# Patient Record
Sex: Male | Born: 1935
Health system: Southern US, Community
[De-identification: ages and names within clinical notes are randomized; demographics above are authoritative.]

## PROBLEM LIST (undated history)

## (undated) DIAGNOSIS — M5417 Radiculopathy, lumbosacral region: Secondary | ICD-10-CM

## (undated) DIAGNOSIS — M8580 Other specified disorders of bone density and structure, unspecified site: Secondary | ICD-10-CM

## (undated) DIAGNOSIS — E785 Hyperlipidemia, unspecified: Secondary | ICD-10-CM

## (undated) DIAGNOSIS — M21372 Foot drop, left foot: Secondary | ICD-10-CM

## (undated) DIAGNOSIS — I2510 Atherosclerotic heart disease of native coronary artery without angina pectoris: Secondary | ICD-10-CM

## (undated) DIAGNOSIS — C801 Malignant (primary) neoplasm, unspecified: Secondary | ICD-10-CM

## (undated) DIAGNOSIS — L719 Rosacea, unspecified: Secondary | ICD-10-CM

## (undated) DIAGNOSIS — H9190 Unspecified hearing loss, unspecified ear: Secondary | ICD-10-CM

## (undated) DIAGNOSIS — I10 Essential (primary) hypertension: Secondary | ICD-10-CM

## (undated) DIAGNOSIS — I739 Peripheral vascular disease, unspecified: Secondary | ICD-10-CM

## (undated) DIAGNOSIS — G5730 Lesion of lateral popliteal nerve, unspecified lower limb: Secondary | ICD-10-CM

## (undated) DIAGNOSIS — G1221 Amyotrophic lateral sclerosis: Secondary | ICD-10-CM

## (undated) DIAGNOSIS — R269 Unspecified abnormalities of gait and mobility: Principal | ICD-10-CM

## (undated) DIAGNOSIS — M47816 Spondylosis without myelopathy or radiculopathy, lumbar region: Secondary | ICD-10-CM

## (undated) DIAGNOSIS — G63 Polyneuropathy in diseases classified elsewhere: Secondary | ICD-10-CM

## (undated) HISTORY — DX: Rosacea, unspecified: L71.9

## (undated) HISTORY — DX: Atherosclerotic heart disease of native coronary artery without angina pectoris: I25.10

## (undated) HISTORY — DX: Foot drop, left foot: M21.372

## (undated) HISTORY — DX: Lesion of lateral popliteal nerve, unspecified lower limb: G57.30

## (undated) HISTORY — DX: Unspecified abnormalities of gait and mobility: R26.9

## (undated) HISTORY — DX: Polyneuropathy in diseases classified elsewhere: G63

## (undated) HISTORY — PX: CARDIAC CATHETERIZATION: SHX172

## (undated) HISTORY — DX: Peripheral vascular disease, unspecified: I73.9

## (undated) HISTORY — DX: Amyotrophic lateral sclerosis: G12.21

## (undated) HISTORY — DX: Other specified disorders of bone density and structure, unspecified site: M85.80

## (undated) HISTORY — PX: BACK SURGERY: SHX140

## (undated) HISTORY — DX: Spondylosis without myelopathy or radiculopathy, lumbar region: M47.816

## (undated) HISTORY — PX: SPINAL CORD STIMULATOR IMPLANT: SHX2422

## (undated) HISTORY — DX: Radiculopathy, lumbosacral region: M54.17

## (undated) HISTORY — DX: Unspecified hearing loss, unspecified ear: H91.90

## (undated) HISTORY — DX: Hyperlipidemia, unspecified: E78.5

## (undated) HISTORY — DX: Essential (primary) hypertension: I10

## (undated) HISTORY — DX: Malignant (primary) neoplasm, unspecified: C80.1

## (undated) HISTORY — PX: CORONARY ARTERY BYPASS GRAFT: SHX141

## (undated) HISTORY — PX: CORONARY STENT PLACEMENT: SHX1402

---

## 1995-05-22 HISTORY — PX: LUNG LOBECTOMY: SHX167

## 2005-12-20 ENCOUNTER — Ambulatory Visit: Admission: RE | Admit: 2005-12-20 | Discharge: 2005-12-20 | Payer: Self-pay | Admitting: Neurosurgery

## 2006-01-08 ENCOUNTER — Inpatient Hospital Stay (HOSPITAL_COMMUNITY): Admission: RE | Admit: 2006-01-08 | Discharge: 2006-01-11 | Payer: Self-pay | Admitting: Neurosurgery

## 2007-01-08 ENCOUNTER — Inpatient Hospital Stay (HOSPITAL_COMMUNITY): Admission: RE | Admit: 2007-01-08 | Discharge: 2007-01-09 | Payer: Self-pay | Admitting: Neurosurgery

## 2007-01-15 ENCOUNTER — Observation Stay (HOSPITAL_COMMUNITY): Admission: RE | Admit: 2007-01-15 | Discharge: 2007-01-15 | Payer: Self-pay | Admitting: Neurosurgery

## 2010-10-03 NOTE — H&P (Signed)
Brandon Smith, Brandon Smith NO.:  [id number]   MEDICAL RECORD NO.:  [id number]          PATIENT TYPE:  INP   LOCATION:  3037                         FACILITY:  [facility]   PHYSICIAN:  Payton Doughty, M.D.      DATE OF BIRTH:  [birth year]   DATE OF ADMISSION:  01/08/2007  DATE OF DISCHARGE:                              HISTORY & PHYSICAL   ADMISSION DIAGNOSES:  1. Spondylosis.  2. 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   This is a very nice now-77 year old right-handed white gentleman who has  had pain in his hip and in his back.  He had a right 5-1 diskectomy in  1997, and he also had a peroneal nerve injury on the left side after a  tibial fracture.  He has had an attempted repair and it has failed.  He  is left with dysesthetic pain much worse on the left than on the right,  but pain in both lower extremities.   PAST MEDICAL HISTORY:  1. Remarkable for coronary artery disease.  He has had a bypass in      1989.  2. Lung operation in 1997 by Dr. Edwyna Shell.   MEDICATIONS:  Vytorin, Benicar, Avodart, aspirin, Aleve, prednisone,  Percocet.   ALLERGIES:  Altace.   SOCIAL HISTORY:  He does not smoke or drink.  He is retired from [employer].   FAMILY HISTORY:  Both parents are deceased, history not given.   REVIEW OF SYSTEMS:  Remarkable for difficulty starting stream, back pain  and leg pain.   PHYSICAL EXAMINATION:  HEENT:  Examination is within normal limits.  He  has reasonable range of motion of neck.  CHEST:  Clear.  CARDIAC:  Regular rate and rhythm.  ABDOMEN:  Nontender.  No hepatosplenomegaly.  EXTREMITIES:  Without clubbing or cyanosis.  GENITOURINARY:  Examination is deferred.  Peripheral pulses are good.  NEUROLOGIC:  He is awake, alert, oriented.  His cranial nerves are  intact.  Motor examination demonstrates 5/5 strength throughout the  upper and lower extremities save for the dorsiflexors on the left, which  are weak because of his peroneal nerve.   CLINICAL IMPRESSION:  Residual peroneal nerve palsy from tibial  fracture.   PLAN:  Spinal cord stimulator.  Need to cover from mid-T8 up to T10.  Risks and benefits have been discussed with him, and he wishes to  proceed.           ______________________________  Payton Doughty, M.D.     MWR/MEDQ  D:  01/08/2007  T:  01/08/2007  Job:  [id number]

## 2010-10-03 NOTE — H&P (Signed)
NAMEKAMALI, NEPHEW NO.:  [id number]   MEDICAL RECORD NO.:  [id number]          PATIENT TYPE:  INP   LOCATION:  5149                         FACILITY:  [facility]   PHYSICIAN:  Payton Doughty, M.D.      DATE OF BIRTH:  Jun 29, 1932   DATE OF ADMISSION:  01/15/2007  DATE OF DISCHARGE:                              HISTORY & PHYSICAL   ADMITTING DIAGNOSIS:  Intractable left leg pain.   SERVICE:  Neurosurgery.   This is a 77 year old gentleman who a week ago underwent placement of a  spinal cord stimulator.  He has good coverage except he needs it a  little bit lower to cover the bottom of his feet.  He is readmitted for  revision of his primary site and implantation of a battery.   MEDICAL HISTORY:  Is fairly benign.  He has coronary artery disease.  He  had a bypass in 1989, has had no heart since then.  He had a 5-1  diskectomy in 1997, lung operation in 1997, repair of a tibial plateau  fracture that left him with peroneal nerve palsy.   MEDICATIONS:  Vytorin, Benicar, Avodart, aspirin, Aleve, prednisone and  Percocet.   ALLERGIES:  ALTACE.   SOCIAL HISTORY:  Does not smoke, does not drink.  He is retired from  [employer].   FAMILY HISTORY:  Both parents are deceased.   REVIEW OF SYSTEMS:  Remarkable for difficulty starting stream, back  pain, leg pain.   HEENT EXAM.  Within normal limits.  He has reasonable range of motion of  his neck.  CHEST:  Clear.  CARDIAC:  Regular rate and rhythm.  ABDOMEN:  Nontender, no hepatosplenomegaly.  EXTREMITIES:  Without clubbing or cyanosis.  GU EXAM:  Deferred.  PERIPHERAL PULSES:  Good.  NEUROLOGICAL:  He is awake, alert and oriented.  His cranial nerves are  intact save for bilateral hearing loss.  Motor exam shows 5/5 strength  throughout the upper and lower extremities save for the dorsiflexion  weakness on the left side.  He is areflexic in the lower extremities.  The incision site looks good.   CLINICAL IMPRESSION:   Intractable pain with good coverage but can be  improved.   The plan is to drop his electrode down one level and then permanently  implant his small battery.  The risks and benefits of this approach have  been discussed with him and he wished to proceed.           ______________________________  Payton Doughty, M.D.     MWR/MEDQ  D:  01/15/2007  T:  01/15/2007  Job:  [id number]

## 2010-10-03 NOTE — Op Note (Signed)
NAMEVALMORE, ARABIE NO.:  [id number]   MEDICAL RECORD NO.:  [id number]          PATIENT TYPE:  INP   LOCATION:  3037                         FACILITY:  [facility]   PHYSICIAN:  Payton Doughty, M.D.      DATE OF BIRTH:  1932/08/06   DATE OF PROCEDURE:  01/08/2007  DATE OF DISCHARGE:                               OPERATIVE REPORT   PREOPERATIVE DIAGNOSIS:  Intractable leg pain.   POSTOPERATIVE DIAGNOSIS:  Intractable leg pain.   OPERATIVE PROCEDURE:  Implantation of spinal cord simulator.   SURGEON:  Payton Doughty, M.D.   ANESTHESIA:  General endotracheal __________ .   COMPLICATIONS:  None.   ASSISTANT:  [place].   BODY OF TEXT:  This is a 76 year old gentleman with bilateral lower  extremity pain, much worse on the left than on the right, taken to the  operating room, sequentially anesthetized and intubated, placed prone on  the operating room table.  Incision was localized with x-ray, following  shaved, prepped and draped in the usual sterile fashion.  The skin was  infiltrated with 1% lidocaine with 1:400,000 epinephrine.  The skin was  incised from over the lamina of T11.  The lamina of T11 was dissected  free in the subperiosteal plane.  Intraoperative x-ray confirmed correct  level.  A small laminectomy of T11 was carried out to allow access to  the posterior epidural space.  Through this an 61 electrode was advanced  such that it covered from mid to upper T11 to lower T8.  This covered  all of T10 and all of T9.  Intraoperative x-ray confirmed good  placement.  The wound was irrigated.  The __________  devices were sewn  into place and the electrodes were attached to the extension cords.  The  extension cords were tunneled using the trocar and exited by separate  incision.  The wires were folded into the incision and closed in several  layers of 0 Vicryl, 2-0 Vicryl and 3-0 nylon.  Betadine and Telfa  dressing was applied at both sides and the patient  returned to the  recovery room in good condition.           ______________________________  Payton Doughty, M.D.     MWR/MEDQ  D:  01/08/2007  T:  01/08/2007  Job:  [id number]

## 2010-10-03 NOTE — Op Note (Signed)
[name], Brandon Smith NO.:  [id number]   MEDICAL RECORD NO.:  [id number]          PATIENT TYPE:  INP   LOCATION:  5149                         FACILITY:  [facility]   PHYSICIAN:  Payton Doughty, M.D.      DATE OF BIRTH:  09/16/1932   DATE OF PROCEDURE:  01/15/2007  DATE OF DISCHARGE:  01/15/2007                               OPERATIVE REPORT   PREOPERATIVE DIAGNOSIS:  Spinal cord stimulator placement.   POSTOPERATIVE DIAGNOSIS:  Spinal cord stimulator placement.   OPERATIVE PROCEDURE:  Revision of spinal cord stimulator electrode and  implantation of battery.   SURGEON:   ANESTHESIA:  General endotracheal anesthesia.   PREPARATION:  Betadine prep and alcohol wipe.   SURGEON:  Payton Doughty, M.D.   BODY OF TEXT:  This is a 77 year old gentleman who has had a stimulator  placed.  He has good coverage down to the top of his and light coverage  on the bottom of his feet.  He was taken to the operating room, smoothly  anesthetized, and intubated, placed prone on the operating table.  Following shave, prep, and drape in the usual sterile fashion, an  electrode was mobilized from this incision.  The extension wires were  disconnected, divided, and removed.  Further laminectomy was carried out  inferiorly over T8 and the electrode pulled back so that it covered from  the top of T8 to the bottom of T10. Previously, the electrodes that had  been working for the patient were at the T9-T10 interspace, so it was  felt that further coverage lower down was warranted.  Following x-ray  confirmation of good placement, they were anchored in place.  Subcutaneous wire was in the right flank and in a subcutaneous pocket,  the battery was placed after being connected to the electrodes.  Impedance testing showed low impedance. Both incisions were irrigated  with bacitracin and closed in successive layers of 2-0 Vicryl and 3-0  nylon.  Betadine and Telfa dressing was applied.  The patient  returned  to the recovery room in good condition.           ______________________________  Payton Doughty, M.D.     MWR/MEDQ  D:  01/15/2007  T:  01/15/2007  Job:  [id number]

## 2010-10-06 NOTE — H&P (Signed)
NAME:  Brandon Smith, Brandon Smith NO.:  [id number]   MEDICAL RECORD NO.:  [id number]          PATIENT TYPE:  INP   LOCATION:  NA                           FACILITY:  [facility]   PHYSICIAN:  Payton Doughty, M.D.      DATE OF BIRTH:  [birth year]   DATE OF ADMISSION:  01/08/2006  DATE OF DISCHARGE:                                HISTORY & PHYSICAL   ADMISSION DIAGNOSIS:  Spondylosis on the left side at L4-5 and L5-S1.   The patient is a 77 year old right-handed white gentleman since beginning of  July started pain into left hip going down to his left leg and his low back.  Quite difficult to walk with and bladder had not been affected.   MEDICAL HISTORY:  Remarkable for coronary artery disease.  He had a bypass  in 1989 and a right 5-1 diskectomy in 1997 and a lung operation by Dr.  Edwyna Shell in 1997.  He had a left tibial fracture in 1999 that left him with a  peroneal nerve palsy on that side and attempted repair failed.   MEDICATIONS ON ADMISSION:  Vytorin 10/40 twice a day, Benicar 10 mg a day,  Avodart 0.5 mg a day, aspirin a day, Aleve 200 mg twice daily, prednisone 10  mg a day and Percocet 5/225 on as needed basis.   ALLERGIES:  ALTACE.   SOCIAL HISTORY:  Does not smoke, does not drink.  Is retired from [employer].   FAMILY HISTORY:  Both parents are deceased. History is not given.   REVIEW OF SYSTEMS:  Remarkable for difficulty __________, extreme back pain,  leg pain.  His HEENT exam is within normal limits.  He had reasonable range  of motion of neck.  Chest clear.  Cardiac exam is regular rate and rhythm.  Abdomen is nontender, no hepatosplenomegaly.  Extremities without cyanosis,  clubbing.  GU exam is deferred.  Peripheral pulses are good.  Neurologically, he is awake, alert and oriented.  Cranial nerves are intact  save for bilateral hearing loss.  Motor exam shows 5/5 strength throughout  the upper and lower extremities save for the dorsiflexors left side which  were 0/5.   There is significant sensory deficit in left peroneal  distribution.  Reflexes are 2 at the knees, absent left ankle obviously,  flicker at right ankle.  Straight leg raise is negative.  MR demonstrates  spondylosis at 4-5 and 5-1.  5-1 is a transitional segment.  There is  laminectomy defect on the right side.   CLINICAL IMPRESSION:  Severe lumbar spondylosis and radiculopathy.   PLAN:  For decompression of L4-5 and L5-S1 laminotomy and foraminotomy and  diskectomy at 5-1.  Risks and benefits of this approach have been discussed  with him and he wished to proceed.           ______________________________  Payton Doughty, M.D.     MWR/MEDQ  D:  01/08/2006  T:  01/08/2006  Job:  [id number]

## 2010-10-06 NOTE — Discharge Summary (Signed)
NAME:  Brandon Smith, Brandon Smith NO.:  [id number]   MEDICAL RECORD NO.:  [id number]          PATIENT TYPE:  INP   LOCATION:  3023                         FACILITY:  [facility]   PHYSICIAN:  Payton Doughty, M.D.      DATE OF BIRTH:  1932/11/02   DATE OF ADMISSION:  01/08/2006  DATE OF DISCHARGE:  01/11/2006                                 DISCHARGE SUMMARY   ADMISSION DIAGNOSIS:  Spondylosis at L4-5 and L5-S1.   DISCHARGE DIAGNOSIS:  Spondylosis at L4-5 and L5-S1, plus urinary retention.   SURGEON:  Payton Doughty, M.D.   COMPLICATIONS:  None.   DISCHARGE ALIGNMENT:  Aligned well, [employer].   A 77 year old right hand white gentleman whose history and physical is  recounted in chart.  He has had difficulty down his left leg and had  significant spondylosis.  Medical history is benign.  Neurologic exam showed  a lumbar radiculopathy on the left side at L5 and S1.  He was admitted after  having normal laboratory values and underwent a L5-S1 laminotomy and  foraminotomy on the left side.  Postoperatively he did well.  He had  complete relief of his leg pain.  Strength is full save for the dorsiflexion  weakness that existed previously due to peroneal nerve injury.  He had  difficulty voiding, catheter was placed overnight.  Voiding trial:  He is  able to void spontaneously with a residual of about 100 cc.  He is being  discharged home in the care of his family with Percocet for pain.  He is to  follow up with me at the [facility] office in about a  week for sutures.           ______________________________  Payton Doughty, M.D.     MWR/MEDQ  D:  01/11/2006  T:  01/11/2006  Job:  [id number]

## 2010-10-06 NOTE — Op Note (Signed)
NAME:  Brandon Smith, Brandon Smith NO.:  [id number]   MEDICAL RECORD NO.:  [id number]          PATIENT TYPE:  INP   LOCATION:  3023                         FACILITY:  [facility]   PHYSICIAN:  Payton Doughty, M.D.      DATE OF BIRTH:  [birth year]   DATE OF PROCEDURE:  01/08/2006  DATE OF DISCHARGE:                                 OPERATIVE REPORT   SURGEON:  Payton Doughty, M.D.   DOCTOR ASSISTANT:  Coletta Memos, M.D.   NURSE ASSISTANT:  [place].   SERVICE:  Neurosurgery.   PREOPERATIVE DIAGNOSIS:  Spondylosis, L4-5, L5-S1 on the left.   POSTOPERATIVE DIAGNOSIS:  Spondylosis, L4-5, L5-S1 on the left.   OPERATIVE PROCEDURE:  L4-5, L5-S1 laminotomy and foraminotomy, done on the  left side.   ANESTHESIA:  General endotracheal.   PREPARATION:  Betadine Scrub with alcohol wipe.   COMPLICATIONS:  None.   BODY OF TEXT:  A 77 year old gentleman with left L5 and S1 radiculopathy,  who was taken to the operating room, smoothly anesthetized, intubated,  placed prone on the operating table.  Following shave, prep and drape in the  usual sterile fashion, the skin was infiltrated with 1% lidocaine with  1:400,000 epinephrine.  The old skin incision was reopened.  The laminae of  L4 and L5 were dissected on the left side.  An intraoperative x-ray  confirmed the correctness of the level.  Having confirmed correctness of the  level, hemisemilaminectomy was carried out on the left side to the top of  the ligamentum flavum, and it was removed in a retrograde fashion.  The  lateral aspect of the canal was then explained and the neural foramina  decompressed.  L4-5 appeared to be more compressed than 5-1.  Following  complete decompression, the wound was irrigated and hemostasis assured, and  the laminectomy defect was filled with Depo-Medrol-soaked fat.  Successive  layers of 0 Vicryl, 2-0 Vicryl and 3-0 nylon were used to close.  Betadine  and Telfa dressing was applied, and the patient  returned to the recovery  room in good condition.           ______________________________  Payton Doughty, M.D.     MWR/MEDQ  D:  01/08/2006  T:  01/09/2006  Job:  [id number]

## 2011-03-02 LAB — COMPREHENSIVE METABOLIC PANEL
ALT: 15
Alkaline Phosphatase: 58
Glucose, Bld: 118 — ABNORMAL HIGH
Potassium: 4.4
Sodium: 139
Total Protein: 7.2

## 2011-03-02 LAB — URINALYSIS, ROUTINE W REFLEX MICROSCOPIC
Bilirubin Urine: NEGATIVE
Glucose, UA: NEGATIVE
Hgb urine dipstick: NEGATIVE
Specific Gravity, Urine: 1.012
pH: 6

## 2011-03-02 LAB — CBC
Hemoglobin: 12.6 — ABNORMAL LOW
RBC: 4.28
RDW: 12.9
WBC: 5.7

## 2011-03-02 LAB — URINE CULTURE: Colony Count: 30000

## 2011-03-02 LAB — ABO/RH: ABO/RH(D): A POS

## 2011-03-02 LAB — PROTIME-INR: INR: 0.9

## 2011-03-02 LAB — TYPE AND SCREEN
ABO/RH(D): A POS
Antibody Screen: NEGATIVE
Antibody Screen: NEGATIVE

## 2011-03-02 LAB — DIFFERENTIAL
Basophils Relative: 0
Eosinophils Absolute: 0.7
Monocytes Absolute: 0.3
Monocytes Relative: 6
Neutrophils Relative %: 58

## 2011-03-05 LAB — CBC
Hemoglobin: 13
RBC: 4.44
WBC: 5.9

## 2011-03-05 LAB — COMPREHENSIVE METABOLIC PANEL
ALT: 20
AST: 19
Alkaline Phosphatase: 59
CO2: 29
GFR calc non Af Amer: >60
Glucose, Bld: 105 — ABNORMAL HIGH
Potassium: 4.5
Sodium: 136

## 2011-03-05 LAB — URINE MICROSCOPIC-ADD ON

## 2011-03-05 LAB — DIFFERENTIAL
Basophils Relative: 0
Eosinophils Absolute: 0.3
Eosinophils Relative: 5
Monocytes Relative: 4
Neutrophils Relative %: 66

## 2011-03-05 LAB — URINALYSIS, ROUTINE W REFLEX MICROSCOPIC
Bilirubin Urine: NEGATIVE
Glucose, UA: NEGATIVE
Hgb urine dipstick: NEGATIVE
Specific Gravity, Urine: 1.011
Urobilinogen, UA: 0.2

## 2011-03-05 LAB — PROTIME-INR: INR: 0.9

## 2012-01-10 ENCOUNTER — Other Ambulatory Visit: Payer: Self-pay | Admitting: Neurosurgery

## 2012-01-10 DIAGNOSIS — M47817 Spondylosis without myelopathy or radiculopathy, lumbosacral region: Secondary | ICD-10-CM

## 2012-01-15 ENCOUNTER — Ambulatory Visit
Admission: RE | Admit: 2012-01-15 | Discharge: 2012-01-15 | Disposition: A | Discharge status: Home / Self Care | Payer: Medicare Other | Source: Ambulatory Visit | Attending: Neurosurgery | Admitting: Neurosurgery

## 2012-01-15 DIAGNOSIS — M47817 Spondylosis without myelopathy or radiculopathy, lumbosacral region: Secondary | ICD-10-CM

## 2012-01-30 ENCOUNTER — Other Ambulatory Visit: Payer: Self-pay

## 2012-01-30 DIAGNOSIS — M79609 Pain in unspecified limb: Secondary | ICD-10-CM

## 2012-02-06 ENCOUNTER — Encounter: Payer: Self-pay | Admitting: Vascular Surgery

## 2012-02-11 ENCOUNTER — Encounter: Payer: Self-pay | Admitting: Vascular Surgery

## 2012-02-12 ENCOUNTER — Encounter: Payer: Self-pay | Admitting: Vascular Surgery

## 2012-02-12 ENCOUNTER — Ambulatory Visit (INDEPENDENT_AMBULATORY_CARE_PROVIDER_SITE_OTHER): Payer: Medicare Other | Admitting: Vascular Surgery

## 2012-02-12 VITALS — BP 150/65 | HR 53 | Resp 16 | Ht 72.0 in | Wt 177.9 lb

## 2012-02-12 DIAGNOSIS — M79609 Pain in unspecified limb: Secondary | ICD-10-CM

## 2012-02-12 DIAGNOSIS — I998 Other disorder of circulatory system: Secondary | ICD-10-CM | POA: Insufficient documentation

## 2012-02-12 DIAGNOSIS — M79606 Pain in leg, unspecified: Secondary | ICD-10-CM

## 2012-02-12 NOTE — Progress Notes (Signed)
Patient complains of tingling in the feet 99% of the time also has weakness in the legs and complains of aching in primarily the right leg, hip, and back, when trying to sleep at night.

## 2012-02-12 NOTE — Progress Notes (Signed)
[facility]   Patient name: Brandon Smith MRN: [id number] DOB: 1932-11-22 Sex: male   Referred by: Dr Trey Sailors  Reason for referral:  Chief Complaint  Patient presents with  . New Evaluation    LE vascular insuff/ Dr. Trey Sailors    HISTORY OF PRESENT ILLNESS: Patient is a 78 year old gentleman with progressive weakness and pain in both lower extremity. He reports that initially he was on a walking program recommended by his cardiologist. Over time he began more and more uncomfortable with walking. He now a walker a little before his legs become tired and "give out" he also reports some lower surety weakness when he walks for a great period of time. He began riding a stationary bike reported pain in his knees with a riding the bike. He also reports a sensation of a stinging and burning sensation in both lower extremities at night which could him from sleeping. He does have a prior history of fracture and trauma with plating in his left tibia. He reportedly had a foot drop associated with this and has had several surgeries to try to correct this unsuccessfully. He does wear a pocket brace in the left foot. He has no history of lower extremity tissue loss.  Past Medical History  Diagnosis Date  . Peroneal nerve palsy     left   . Hypertension   . Foot drop, left     chronic  . Lumbar spondylosis   . CAD (coronary artery disease)   . Cancer     lung  . Peripheral vascular disease     Past Surgical History  Procedure Date  . Back surgery 2952,[id number]    lumbosacral  . Spinal cord stimulator implant   . Lung lobectomy   . Cardiac catheterization   . Coronary stent placement     X4    History   Social History  . Marital Status: Married    Spouse Name: N/A    Number of Children: N/A  . Years of Education: N/A   Occupational History  . Not on file.   Social History Main Topics  . Smoking status: Former Smoker    Types: Cigarettes    Quit date:  07/19/1980  . Smokeless tobacco: Never Used  . Alcohol Use: No  . Drug Use: No  . Sexually Active: Not on file   Other Topics Concern  . Not on file   Social History Narrative  . No narrative on file    Family History  Problem Relation Age of Onset  . Cancer Mother   . Heart disease Mother   . Hyperlipidemia Mother   . Heart attack Mother   . Heart disease Father     before age 14  . Heart attack Father   . Cancer Sister     breast  . Hyperlipidemia Sister   . Hypertension Sister   . Heart attack Sister   . Cancer Brother     lung  . Heart disease Brother   . Hyperlipidemia Son     Allergies as of 02/12/2012 - Review Complete 02/12/2012  Allergen Reaction Noted  . Altace (ramipril) Rash 02/06/2012    Current Outpatient Prescriptions on File Prior to Visit  Medication Sig Dispense Refill  . aspirin 81 MG tablet Take 81 mg by mouth daily.      . carvedilol (COREG) 12.5 MG tablet Take 12.5 mg by mouth Twice daily.      . clopidogrel (  PLAVIX) 75 MG tablet Take 75 mg by mouth Daily.      . furosemide (LASIX) 20 MG tablet Take 20 mg by mouth Daily.      Marland Kitchen losartan (COZAAR) 100 MG tablet Take 100 mg by mouth Daily.      . rosuvastatin (CRESTOR) 10 MG tablet Take 10 mg by mouth daily.      . Zinc 100 MG TABS Take 100 mg by mouth daily.      . bisacodyl (BISACODYL) 5 MG EC tablet Take 5 mg by mouth daily as needed.      . Pitavastatin Calcium (LIVALO) 4 MG TABS Take 4 mg by mouth daily.         REVIEW OF SYSTEMS:  Positives indicated with an "X"  CARDIOVASCULAR:  [ ]  chest pain   [ ]  chest pressure   [ ]  palpitations   [ ]  orthopnea   [ ]  dyspnea on exertion   [ ]  claudication   [ ]  rest pain   [ ]  DVT   [ ]  phlebitis PULMONARY:   [ ]  productive cough   [ ]  asthma   [ ]  wheezing NEUROLOGIC:   [x ] weakness  [ ]  paresthesias  [ ]  aphasia  [ ]  amaurosis  [ ]  dizziness HEMATOLOGIC:   [ ]  bleeding problems   [ ]  clotting disorders MUSCULOSKELETAL:  [ ]  joint pain   [ ]   joint swelling GASTROINTESTINAL: [ ]   blood in stool  [ ]   hematemesis GENITOURINARY:  [ ]   dysuria  [ ]   hematuria PSYCHIATRIC:  [ ]  history of major depression INTEGUMENTARY:  [ ]  rashes  [ ]  ulcers CONSTITUTIONAL:  [ ]  fever   [ ]  chills  PHYSICAL EXAMINATION:  General: The patient is a well-nourished male, in no acute distress. Vital signs are BP 150/65  Pulse 53  Resp 16  Ht 6' (1.829 m)  Wt 177 lb 14.4 oz (80.695 kg)  BMI 24.13 kg/m2  SpO2 100% Pulmonary: There is a good air exchange bilaterally without wheezing or rales. Abdomen: Soft and non-tender with normal pitch bowel sounds. Musculoskeletal: There are no major deformities.  There is no significant extremity pain. Neurologic: No focal weakness or paresthesias are detected, Skin: There are no ulcer or rashes noted. Psychiatric: The patient has normal affect. Cardiovascular: There is a regular rate and rhythm without significant murmur appreciated. Carotid arteries without bruits bilaterally Pulse status: 2+ radial 2+ femoral and 2+ posterior tibial pulses bilaterally  VVS Vascular Lab Studies:  Ordered and Independently Reviewed lower surety arterial studies reveal normal ankle arm index normal triphasic waveforms bilaterally patient also has normal digital pressures bilaterally  Impression and Plan:  Oh extremity discomfort at rest and with walking. A long discussion with the patient and his wife present. I explained that he does not have any evidence of lower surety arterial insufficiency by physical exam or noninvasive testing which suggests this as an etiology of his discomfort. He will continue his followup with Dr. Teodoro Kil for further recommendations    EARLY, TODD [facility] [place] Office: [phone]

## 2012-04-22 ENCOUNTER — Telehealth: Payer: Self-pay

## 2012-04-22 NOTE — Telephone Encounter (Addendum)
Rec'd phone call from Dr. Loraine Leriche Roy's office requesting approval from Dr. Arbie Cookey for pt. to proceed with Lumbar fusion on 04/23/12.  Also, requests approval for pt. to stop Plavix for 7 days prior to procedure.  Spoke with Dr. Arbie Cookey.  Advised that the arterial studies were normal in our office 02/12/12, suggesting there is no reason from a vascular standpoint that pt. is on Plavix, nor is there any reason the patient should not proceed with surgery.   Will fax above note to Dr. Loraine Leriche Roy's office.

## 2013-01-23 ENCOUNTER — Encounter: Payer: Self-pay | Admitting: Neurology

## 2013-01-28 ENCOUNTER — Encounter: Payer: Self-pay | Admitting: Neurology

## 2013-01-28 ENCOUNTER — Ambulatory Visit (INDEPENDENT_AMBULATORY_CARE_PROVIDER_SITE_OTHER): Payer: Medicare Other | Admitting: Neurology

## 2013-01-28 VITALS — BP 138/58 | HR 53 | Ht 71.0 in | Wt 180.0 lb

## 2013-01-28 DIAGNOSIS — R269 Unspecified abnormalities of gait and mobility: Secondary | ICD-10-CM

## 2013-01-28 DIAGNOSIS — R6889 Other general symptoms and signs: Secondary | ICD-10-CM

## 2013-01-28 DIAGNOSIS — G63 Polyneuropathy in diseases classified elsewhere: Secondary | ICD-10-CM | POA: Insufficient documentation

## 2013-01-28 DIAGNOSIS — D518 Other vitamin B12 deficiency anemias: Secondary | ICD-10-CM

## 2013-01-28 HISTORY — DX: Polyneuropathy in diseases classified elsewhere: G63

## 2013-01-28 HISTORY — DX: Unspecified abnormalities of gait and mobility: R26.9

## 2013-01-28 NOTE — Progress Notes (Signed)
Reason for visit: Gait disorder  Brandon Smith is a 79 y.o. male  History of present illness:  Brandon Smith is a 79 year old right-handed white male with a history of lumbosacral spine disease, and a peripheral neuropathy. The patient had EMG and nerve conduction studies through this office approximately one year ago. This revealed evidence of a peripheral neuropathy of moderate severity, with a severe, end-stage left peroneal neuropathy. The patient had a multilevel bilateral lumbosacral radiculopathy involving the L2-3, L4, and L5 nerve roots, possibly the S1 nerve roots as well. The patient has undergone lumbosacral spine surgery on 2 occasions since that time, one surgery was done in December 2013, and another surgery was done in February 2014 by Dr. Channing Mutters. Since the surgery, the patient indicates that he has developed a worsening gait problem, and he has had multiple falls, with the last fall about 2 weeks ago. The patient is now using a walker in the house, and he uses a cane when out of the house. The patient feels as if he is dragging his left leg at times, and he sometimes feels as if he cannot get the leg to do what he wants it to do. The patient feels weak in the legs, but he is doing exercises at home with leg strengthening and some balance training. The patient feels numb in the feet, with some burning sensations in the feet at night. The patient indicates that this began after the lumbosacral spine surgery. The patient denies any problems controlling the bowels or the bladder. The patient denies any neck pain or arm discomfort, and he denies any memory changes. The patient is sent to this office for further evaluation.  Past Medical History  Diagnosis Date  . Peroneal nerve palsy     left   . Hypertension   . Foot drop, left     chronic  . Lumbar spondylosis   . CAD (coronary artery disease)   . Cancer     lung  . Peripheral vascular disease   . Hearing deficit     right hearing aid   . Abnormality of gait 01/28/2013  . Polyneuropathy in other diseases classified elsewhere 01/28/2013  . Rosacea   . Dyslipidemia   . Osteopenia     Past Surgical History  Procedure Laterality Date  . Back surgery  [id number]    lumbosacral  . Spinal cord stimulator implant    . Lung lobectomy    . Cardiac catheterization    . Coronary stent placement      X4    Family History  Problem Relation Age of Onset  . Cancer Mother   . Heart disease Mother   . Hyperlipidemia Mother   . Heart attack Mother   . Heart disease Father     before age 37  . Heart attack Father   . Cancer Sister     breast  . Hyperlipidemia Sister   . Hypertension Sister   . Heart attack Sister   . Cancer Brother     lung  . Heart disease Brother   . Hyperlipidemia Son     Social history:  reports that he quit smoking about 32 years ago. His smoking use included Cigarettes. He smoked 0.00 packs per day. He has never used smokeless tobacco. He reports that he does not drink alcohol or use illicit drugs.  Medications:  Current Outpatient Prescriptions on File Prior to Visit  Medication Sig Dispense Refill  . aspirin 81 MG tablet  Take 81 mg by mouth daily.      . carvedilol (COREG) 12.5 MG tablet Take 12.5 mg by mouth Twice daily.      . clopidogrel (PLAVIX) 75 MG tablet Take 75 mg by mouth Daily.      Tery Sanfilippo Calcium (STOOL SOFTENER PO) Take by mouth daily.      . furosemide (LASIX) 20 MG tablet Take 20 mg by mouth Daily.      Marland Kitchen losartan (COZAAR) 100 MG tablet Take 100 mg by mouth Daily.      . rosuvastatin (CRESTOR) 10 MG tablet Take 10 mg by mouth daily.      . Zinc 100 MG TABS Take 100 mg by mouth daily.       No current facility-administered medications on file prior to visit.      Allergies  Allergen Reactions  . Altace [Ramipril] Rash    ROS:  Out of a complete 14 system review of symptoms, the patient complains only of the following symptoms, and all other reviewed systems are  negative.  Hearing loss, ringing in the ears Moles Weakness of the legs Decreased energy Snoring  Blood pressure 138/58, pulse 53, height 5\' 11"  (1.803 m), weight 180 lb (81.647 kg).  Physical Exam  General: The patient is alert and cooperative at the time of the examination.  Head: Pupils are equal, round, and reactive to light. Discs are flat bilaterally.  Neck: The neck is supple, no carotid bruits are noted.  Respiratory: The respiratory examination is clear.  Cardiovascular: The cardiovascular examination reveals a regular rate and rhythm, no obvious murmurs or rubs are noted.  Skin: Extremities are without significant edema. The patient wears an AFO brace on the left ankle.  Neurologic Exam  Mental status:  Cranial nerves: Facial symmetry is present. There is good sensation of the face to pinprick and soft touch bilaterally. The strength of the facial muscles and the muscles to head turning and shoulder shrug are normal bilaterally. Speech is well enunciated, no aphasia or dysarthria is noted. Extraocular movements are full. Visual fields are full.  Motor: The motor testing reveals 5 over 5 strength of the upper extremities. With the lower extremities, there is proximal weakness, 4/5, bilaterally. There is a prominent footdrop on the left, and a mild footdrop on the right. Otherwise, the strength of the lower extremities is relatively normal. Good symmetric motor tone is noted throughout.  Sensory: Sensory testing is intact to pinprick, soft touch, vibration sensation, and position sense on the upper extremities. With the lower extremities, there is a stocking pattern pinprick sensory deficit one half way up the leg on the left, and across the ankle on the right. Vibration sensation is impaired in both feet, and position sensation is impaired on the right foot, not on the left. No evidence of extinction is noted.  Coordination: Cerebellar testing reveals good  finger-nose-finger and heel-to-shin bilaterally.  Gait and station: Gait is slightly wide-based, the patient has a slight circumduction type gait on the left leg. Tandem gait is unsteady. Romberg is negative. No drift is seen.  Reflexes: Deep tendon reflexes are symmetric and normal bilaterally, with the exception that the ankle jerk reflexes are absent bilaterally. Toes are downgoing bilaterally.   Assessment/Plan:  One. Peripheral neuropathy  2. Lumbosacral spine surgery  3. Gait disorder  The patient likely has a multifactorial gait disorder associated with the peripheral neuropathy and the lumbosacral spine disease. The patient has proximal weakness in both legs, and he  is on a statin drugs, and an underlying myopathy needs to be excluded. The patient will be set up for blood work today, and considerations for CT of the brain and cervical spine may be made in the near future. The patient cannot have MRI evaluation secondary to a spinal stimulator placement. The patient will followup in about 4 months. The patient will continue the leg strengthening exercises. The patient may need to use a walker at all times. A repeat nerve conduction study will be considered.  Marlan Palau MD 01/28/2013 7:24 PM  [facility] [address] [place], [place] [address]  Phone [phone] Fax [phone]

## 2013-01-30 ENCOUNTER — Other Ambulatory Visit: Payer: Self-pay | Admitting: Neurology

## 2013-02-09 ENCOUNTER — Telehealth: Payer: Self-pay | Admitting: Neurology

## 2013-02-11 LAB — LYME, TOTAL AB TEST/REFLEX: Lyme IgG/IgM Ab: <0.91 {ISR} (ref 0.00–0.90)

## 2013-02-11 LAB — IFE AND PE, SERUM
Albumin/Glob SerPl: 1.6 (ref 0.7–2.0)
Alpha2 Glob SerPl Elph-Mcnc: 0.5 g/dL (ref 0.4–1.2)
B-Globulin SerPl Elph-Mcnc: 1 g/dL (ref 0.6–1.3)
Gamma Glob SerPl Elph-Mcnc: 1 g/dL (ref 0.5–1.6)
Globulin, Total: 2.7 g/dL (ref 2.0–4.5)
IgG (Immunoglobin G), Serum: 1319 mg/dL (ref 700–1600)
Total Protein: 7 g/dL (ref 6.0–8.5)

## 2013-02-11 LAB — RPR: RPR: NONREACTIVE

## 2013-02-11 LAB — ANA W/REFLEX: Anti Nuclear Antibody(ANA): NEGATIVE

## 2013-02-11 LAB — TSH: TSH: 2.09 u[IU]/mL (ref 0.450–4.500)

## 2013-02-12 ENCOUNTER — Telehealth: Payer: Self-pay

## 2013-02-12 NOTE — Telephone Encounter (Signed)
I called and left VM, Labs were unremarkable.

## 2013-02-12 NOTE — Telephone Encounter (Signed)
Message copied by [facility] on Thu Feb 12, 2013 12:53 PM ------      Message from: Stephanie Acre      Created: Wed Feb 11, 2013 10:05 AM       Please call the patient. The blood work results are unremarkable. Thank you.            ----- Message -----         From: [facility] Lab Results In Interface         Sent: 02/11/2013   9:41 AM           To: York Spaniel, MD                   ------

## 2013-02-13 NOTE — Telephone Encounter (Signed)
RN-Valerie called patient yesterday with lab results. Closing encounter.

## 2013-03-17 ENCOUNTER — Telehealth: Payer: Self-pay | Admitting: Neurology

## 2013-03-17 DIAGNOSIS — R269 Unspecified abnormalities of gait and mobility: Secondary | ICD-10-CM

## 2013-03-17 NOTE — Telephone Encounter (Signed)
I called patient. He apparently did get the messages concerning the above work results. At this point, I will repeat the EMG nerve conduction study, and get MRI of the brain and cervical spine. The patient indicates that the spinal stimulator has been removed.  Nerve conduction studies will be done on both legs and one arm, EMG on both legs.

## 2013-03-25 ENCOUNTER — Other Ambulatory Visit: Payer: Self-pay | Admitting: Neurosurgery

## 2013-03-25 DIAGNOSIS — M25551 Pain in right hip: Secondary | ICD-10-CM

## 2013-03-27 ENCOUNTER — Encounter (INDEPENDENT_AMBULATORY_CARE_PROVIDER_SITE_OTHER): Payer: Medicare Other

## 2013-03-27 ENCOUNTER — Encounter: Payer: Self-pay | Admitting: Neurology

## 2013-03-27 ENCOUNTER — Ambulatory Visit (INDEPENDENT_AMBULATORY_CARE_PROVIDER_SITE_OTHER): Payer: Medicare Other | Admitting: Neurology

## 2013-03-27 DIAGNOSIS — Z0289 Encounter for other administrative examinations: Secondary | ICD-10-CM

## 2013-03-27 DIAGNOSIS — M5417 Radiculopathy, lumbosacral region: Secondary | ICD-10-CM | POA: Insufficient documentation

## 2013-03-27 DIAGNOSIS — R269 Unspecified abnormalities of gait and mobility: Secondary | ICD-10-CM

## 2013-03-27 DIAGNOSIS — M6281 Muscle weakness (generalized): Secondary | ICD-10-CM

## 2013-03-27 DIAGNOSIS — G63 Polyneuropathy in diseases classified elsewhere: Secondary | ICD-10-CM

## 2013-03-27 HISTORY — DX: Radiculopathy, lumbosacral region: M54.17

## 2013-03-27 NOTE — Procedures (Signed)
HISTORY:  Brandon Smith is a 79 year old gentleman with a progressive gait disorder. The patient has had multiple lumbosacral spine procedures, and he has had evidence of a peripheral neuropathy by EMG and nerve conduction studies. The patient has multilevel lumbosacral radiculopathies bilaterally. The patient has a severe end-stage left peroneal neuropathy. The patient has noted collapse of the legs when he stands in one place for to long. The patient has fallen on occasion. He is using a walker for ambulation. The patient is being evaluated for the gait disorder, and the lower extremity weakness.  NERVE CONDUCTION STUDIES:  Nerve conduction studies were performed on both lower extremities. The study of the left peroneal nerve was unobtainable. The distal motor latencies for the right peroneal nerve and for the posterior tibial nerves bilaterally were normal, with low motor amplitudes for these nerves. The nerve conduction velocities for the right peroneal nerve and for the posterior tibial nerves bilaterally were normal. The peroneal sensory latencies were absent bilaterally, and the H reflex latencies were absent bilaterally.  EMG STUDIES:  EMG study was performed on the right lower extremity:  The tibialis anterior muscle reveals 2 to 6K motor units with markedly decreased recruitment. No fibrillations or positive waves were seen. The peroneus tertius muscle reveals 2 to 5K motor units with moderately decreased recruitment. No fibrillations or positive waves were seen. The medial gastrocnemius muscle reveals 2 to 4K motor units with decreased recruitment. 2+ fibrillations and positive waves were seen. The vastus lateralis muscle reveals 2 to 5K motor units with decreased recruitment. One plus fibrillations and positive waves were seen. The iliopsoas muscle reveals 2 to 5K motor units with decreased recruitment. No fibrillations or positive waves were seen. One plus fasciculations were seen. The  biceps femoris muscle (long head) reveals 2 to 4K motor units with full recruitment. No fibrillations or positive waves were seen. The lumbosacral paraspinal muscles were tested at 3 levels, and revealed 1+ fibrillations and positive waves at the upper level, 2+ fibrillations and positive waves at the middle and lower levels.. There was good relaxation.  EMG study was performed on the left lower extremity:  The tibialis anterior muscle reveals no motor units with no recruitment. No fibrillations or positive waves were seen. The peroneus tertius muscle reveals no motor units with no recruitment. No fibrillations or positive waves were seen. The medial gastrocnemius muscle reveals 2 to 5K motor units with decreased recruitment. 2+ fibrillations and positive waves were seen. The vastus lateralis muscle reveals 2 to 6K motor units with markedly decreased recruitment. No fibrillations or positive waves were seen. The iliopsoas muscle reveals 2 to 5K motor units with decreased recruitment. No fibrillations or positive waves were seen. The biceps femoris muscle (long head) reveals 2 to 6K motor units with moderately decreased recruitment. No fibrillations or positive waves were seen. The lumbosacral paraspinal muscles were tested at 3 levels, and revealed no abnormalities of insertional activity at all 3 levels tested. There was good relaxation.   IMPRESSION:  Nerve conduction studies reveal evidence of a peripheral neuropathy of moderate severity, primarily axonal. There is a severe end-stage left peroneal neuropathy. EMG evaluation of the right lower extremity shows multilevel lumbosacral radiculopathies that are chronic at the L5 level, and acute and chronic at the S1 and L4 levels. EMG evaluation of the left lower extremity shows multi-level lumbosacral radiculopathies at the L3, L4, L5, and S1 levels. EMG of the left lower extremity confirms a severe, end-stage peroneal neuropathy. In comparison to a  prior study done in August of 2013, the peripheral neuropathy is relatively unchanged. EMG evaluation of the right lower extremity appears to show similar findings from 2013, but the acute denervation is slightly more prominent. The EMG evaluation on the left lower extremity is very similar to what was seen in 2013.  Marlan Palau MD 03/27/2013 1:55 PM  [facility] [address] [place], [place] [address]  Phone [phone] Fax [phone]

## 2013-03-27 NOTE — Progress Notes (Signed)
Moises Terpstra is a 79 year old gentleman with a history of lumbosacral spine disease, multiple lumbosacral spine procedures. The patient has chronic low back pain, but he has developed a progressive weakness with the lower extremities, occasional falls. The patient has a known severe end-stage left peroneal neuropathy. The patient has a peripheral neuropathy. The patient indicates that his legs will collapse if he is standing still for long periods of time. The patient walks with a cane or a walker. Due to the progressive weakness of the lower extremities, the patient is being reevaluated with an EMG and nerve conduction study.  Nerve conduction studies continued to show a peripheral neuropathy that has not changed much since August of 2013. EMG evaluation for the most part looks fairly similar to August of 2013 study of the lower extremities, but the acute denervation severity has increased slightly in the right leg.  The patient will be sent for MRI evaluation of the cervical spine and brain.  Blood work will be sent today for a acetylcholine receptor antibody, LEMS antibody.  The patient will followup in 2-3 months.   Etiology of a progressive weakness is not clear, could be related to a scarring process in the lumbosacral spine. The patient denies any issues with control of the bowels or the bladder.

## 2013-04-02 LAB — ACETYLCHOLINE RECEPTOR, BINDING: AChR Binding Ab, Serum: <0.03 nmol/L (ref 0.00–0.24)

## 2013-04-02 LAB — VGCC ANTIBODY: VGCC Antibody: NEGATIVE

## 2013-04-03 ENCOUNTER — Ambulatory Visit
Admission: RE | Admit: 2013-04-03 | Discharge: 2013-04-03 | Disposition: A | Discharge status: Home / Self Care | Payer: Medicare Other | Source: Ambulatory Visit | Attending: Neurosurgery | Admitting: Neurosurgery

## 2013-04-03 DIAGNOSIS — M25551 Pain in right hip: Secondary | ICD-10-CM

## 2013-04-03 MED ORDER — METHYLPREDNISOLONE ACETATE 40 MG/ML INJ SUSP (RADIOLOG
120.0000 mg | Freq: Once | INTRAMUSCULAR | Status: AC
  Administered 2013-04-03: 120 mg via INTRAMUSCULAR

## 2013-04-03 MED ORDER — IOHEXOL 180 MG/ML  SOLN
1.0000 mL | Freq: Once | INTRAMUSCULAR | Status: AC | PRN
  Administered 2013-04-03: 1 mL

## 2013-04-08 ENCOUNTER — Ambulatory Visit
Admission: RE | Admit: 2013-04-08 | Discharge: 2013-04-08 | Disposition: A | Discharge status: Home / Self Care | Payer: Medicare Other | Source: Ambulatory Visit | Attending: Neurology | Admitting: Neurology

## 2013-04-08 DIAGNOSIS — R269 Unspecified abnormalities of gait and mobility: Secondary | ICD-10-CM

## 2013-04-10 ENCOUNTER — Telehealth: Payer: Self-pay | Admitting: Neurology

## 2013-04-10 NOTE — Telephone Encounter (Signed)
I called the patient. The MRI the brain shows a moderate level small vessel disease, could be a contributing factor with his walking. Cervical spine MRI does not show spinal cord compression. The patient was told to go off of his Crestor by Dr. Channing Mutters. The patient did this 2 weeks ago, and he feels that there has been some minor benefit coming off this medication. He may need to stay off the medication for least 3 months to determine whether cessation of this medication made a difference.

## 2013-08-12 ENCOUNTER — Ambulatory Visit: Payer: Medicare Other | Admitting: Neurology

## 2014-01-18 ENCOUNTER — Encounter: Payer: Self-pay | Admitting: Neurology

## 2014-01-18 ENCOUNTER — Ambulatory Visit (INDEPENDENT_AMBULATORY_CARE_PROVIDER_SITE_OTHER): Payer: Commercial Managed Care - HMO | Admitting: Neurology

## 2014-01-18 VITALS — BP 144/64 | HR 60 | Ht 71.0 in | Wt 182.6 lb

## 2014-01-18 DIAGNOSIS — G1220 Motor neuron disease, unspecified: Secondary | ICD-10-CM

## 2014-01-18 DIAGNOSIS — G1221 Amyotrophic lateral sclerosis: Secondary | ICD-10-CM

## 2014-01-18 DIAGNOSIS — R292 Abnormal reflex: Secondary | ICD-10-CM

## 2014-01-18 LAB — PHOSPHORUS: PHOSPHORUS: 3.1 mg/dL (ref 2.3–4.6)

## 2014-01-18 LAB — VITAMIN B12: Vitamin B-12: 397 pg/mL (ref 211–911)

## 2014-01-18 LAB — CALCIUM: CALCIUM: 9.5 mg/dL (ref 8.4–10.5)

## 2014-01-18 LAB — T4, FREE: Free T4: 1.21 ng/dL (ref 0.80–1.80)

## 2014-01-18 LAB — TSH: TSH: 1.929 u[IU]/mL (ref 0.350–4.500)

## 2014-01-18 NOTE — Patient Instructions (Signed)
1. Your provider has requested that you have labwork completed today. Please go to [facility] on the first floor of this building before leaving the office today. 2. We will call you with an appt for your EMG.

## 2014-01-18 NOTE — Progress Notes (Signed)
Note faxed to Dr Carlus Pavlov at [phone] with confirmation received.

## 2014-01-18 NOTE — Progress Notes (Signed)
Brandon Smith was seen today in the movement disorders clinic for neurologic consultation at the request of Dr. Manson Passey.  Pts pcp is Brandon,[name], MD.  The consultation is for the evaluation of possible PD.  Pt has previously seen Dr. Jannifer Smith and I did have the opportunity to review those notes.  He saw Dr. Jannifer Smith on 01/28/2013.  Dr. Jannifer Smith diagnosed the patient with "multifactorial gait disorder" that he felt was due to peripheral neuropathy and lumbosacral spinal stenosis.  He had I nerve conduction study done by Dr. Jannifer Smith on 03/27/2013.  This demonstrated evidence of peripheral neuropathy, but also "acute denervation" potentials in the right lower extremity as well as a chronic, advanced peroneal neuropathy on the left.  He had an MRI of the cervical spine in November, 2014 demonstrate spondylosis at C5-C6 and C6-C7.  There was an MRI of the brain completed in November, 2014 I had the opportunity to review.  There was evidence of atrophy as well as moderate small vessel disease.  Pt is accompanied today by his son and wife who supplements the history.  Pt reports that in the fall of 2013, pt began to note loss of strength in the LE.  He began to note that the legs couldn't tolerate long walks.  He went to his PCP who referred him to a neurosurgeon in [place] (Dr. Elouise Smith).  He was told that he would need a lumbar fusion and he had that in 04/2012.  He states that he had to have another back surgery in 06/2012 as his screws began to extrude from the previous surgery.  He did PT after the back surgery but felt that the legs were continuing to get weak.  He went back to Dr. Elouise Smith and had a normal CT and then he was referred to Dr. Jannifer Smith.  Pt felt that he was getting worse and saw an ortho dr at [facility] first (Dr. Marvel Plan) but was asked to seek consultation with Dr. Radford Smith.  Dr. Radford Smith felt that he had PD.  He does state that he went to PT in [place] about 3-4 months ago but pt didn't think that it was  helpful.    Specific Symptoms:  Tremor: No. Voice: weaker per patient and family x 2 - 4 months Sleep: sleeps well  Vivid Dreams:  No. (dreams frequently)  Acting out dreams:  No. (occasional talking in sleep) Wet Pillows: No. Postural symptoms:  Yes.  ; uses walker and has for one year.    Falls?  Yes.   (last fall was 2 months ago - was trying to get out of chair and hit dining room table/magazine rack; drop foot since 1999 when fell off ladder and has worn brace on the L leg since) Bradykinesia symptoms: difficulty with initiating movement, shuffling gait, difficulty getting out of a chair and difficulty regaining balance Loss of smell:  Yes.   x 3 years Loss of taste:  No. Urinary Incontinence:  Yes.   but rare and due to not being able to get to the bathroom fast enough Difficulty Swallowing:  No. Handwriting, micrographia: No.  (handwriting got bigger) Trouble with ADL's:  Yes.   (trouble shaving and has to now sit to do it or will fall; sits down to brush teeth)  Trouble buttoning clothing: No. Depression:  No. Memory changes:  No. Hallucinations:  No.  visual distortions: No. N/V:  No. Lightheaded:  No.  Syncope: No. Diplopia:  No. Dyskinesia:  No.  PREVIOUS MEDICATIONS: none to date  ALLERGIES:   Allergies  Allergen Reactions  . Altace [Ramipril] Rash    CURRENT MEDICATIONS:  Outpatient Encounter Prescriptions as of 01/18/2014  Medication Sig  . alendronate (FOSAMAX) 70 MG tablet Take 70 mg by mouth once a week. Saturday  . aspirin 81 MG tablet Take 81 mg by mouth daily.  . Calcium-Vitamin D 600-200 MG-UNIT per tablet Take by mouth.  . carvedilol (COREG) 12.5 MG tablet Take 12.5 mg by mouth Twice daily.  . clopidogrel (PLAVIX) 75 MG tablet Take 75 mg by mouth Daily.  Brandon Smith Calcium (STOOL SOFTENER PO) Take by mouth daily.  Brandon Smith doxycycline (PERIOSTAT) 20 MG tablet Take 20 mg by mouth daily.  Brandon Smith EAR WAX REMOVAL DROPS 6.5 % otic solution   . furosemide  (LASIX) 20 MG tablet Take 20 mg by mouth Daily.  Brandon Smith gabapentin (NEURONTIN) 300 MG capsule Take 300 mg by mouth daily.  Brandon Smith losartan (COZAAR) 100 MG tablet Take 100 mg by mouth Daily.  . metroNIDAZOLE (METROCREAM) 0.75 % cream Apply 7.89 application topically daily.  . minocycline (DYNACIN) 75 MG tablet once daily.  . minocycline (DYNACIN) 75 MG tablet   . NITROSTAT 0.4 MG SL tablet Take 0.4 mg by mouth as needed.  . pantoprazole (PROTONIX) 40 MG tablet Take 40 mg by mouth daily.  . polyethylene glycol (MIRALAX / GLYCOLAX) packet Take by mouth.  . rosuvastatin (CRESTOR) 10 MG tablet Take 10 mg by mouth daily.  . Zinc 100 MG TABS Take 100 mg by mouth daily.    PAST MEDICAL HISTORY:   Past Medical History  Diagnosis Date  . Peroneal nerve palsy     left after fall off ladder  . Hypertension   . Foot drop, left     chronic  . Lumbar spondylosis   . CAD (coronary artery disease)   . Cancer     lung  . Peripheral vascular disease   . Hearing deficit     right hearing aid  . Abnormality of gait 01/28/2013  . Polyneuropathy in other diseases classified elsewhere 01/28/2013  . Rosacea   . Dyslipidemia   . Osteopenia   . Lumbosacral radiculopathy 03/27/2013    PAST SURGICAL HISTORY:   Past Surgical History  Procedure Laterality Date  . Back surgery  2008,2010, 2013, 2014    lumbosacral  . Spinal cord stimulator implant      and removal in 04/2012  . Lung lobectomy  1997  . Cardiac catheterization    . Coronary stent placement      X4    SOCIAL HISTORY:   History   Social History  . Marital Status: Married    Spouse Name: N/A    Number of Children: 3  . Years of Education: HS   Occupational History  . retired     Data processing manager; computers   Social History Main Topics  . Smoking status: Former Smoker    Types: Cigarettes    Quit date: 07/19/1980  . Smokeless tobacco: Never Used  . Alcohol Use: No  . Drug Use: No  . Sexual Activity: Not on file   Other Topics  Concern  . Not on file   Social History Narrative  . No narrative on file    FAMILY HISTORY:   Family Status  Relation Status Death Age  . Mother Deceased     died after colon obstruction; MI  . Father Deceased     MI  . Brother  Deceased     2, one as baby, one from lung CA  . Sister Deceased     55, "old age", obesity  . Child Alive     3, healthy    ROS:  A complete 10 system review of systems was obtained and was unremarkable apart from what is mentioned above.  PHYSICAL EXAMINATION:    VITALS:   Filed Vitals:   01/18/14 0948  BP: 144/64  Pulse: 60  Height: 5\' 11"  (1.803 m)  Weight: 182 lb 9 oz (82.81 kg)  SpO2: 95%    GEN:  The patient appears stated age and is in NAD. HEENT:  Normocephalic, atraumatic.  The mucous membranes are moist. The superficial temporal arteries are without ropiness or tenderness. CV:  RRR Lungs:  CTAB Neck/HEME:  There are no carotid bruits bilaterally.  Neurological examination:  Orientation: The patient is alert and oriented x3. Fund of knowledge is appropriate.  Recent and remote memory are intact.  Attention and concentration are normal.    Able to name objects and repeat phrases. Cranial nerves: There is good facial symmetry. Pupils are equal round and reactive to light bilaterally. Fundoscopic exam reveals clear margins bilaterally. Extraocular muscles are intact. The visual fields are full to confrontational testing. The speech is fluent and clear. Soft palate rises symmetrically and there is no tongue deviation. Hearing is slightly decreased to conversational tone. Sensation: Sensation is intact to light and pinprick throughout (facial, trunk, extremities). Vibration is intact at the bilateral big toe. There is no extinction with double simultaneous stimulation. There is no sensory dermatomal level identified. Motor: Strength is 5/5 in the bilateral upper extremities.  There is shoulder girdle wasting bilaterally with fasiculations  about the supraspinatous/infraspinatous and trapezius region bilaterally.  There are fasciculations in the UE, both proximally and distally.  There is some wasting of the intrinsic mm of the hands (FDIH bilaterally).   Strength in the Deep tendon reflexes: Deep tendon reflexes are 3/4 at the bilateral biceps, triceps, brachioradialis, patella and trace at the bilateral achilles. There are bilateral cross adductor reflexes at the knee.  There are pectoral reflexes bilaterally.  Strength in the proximal LE is 4-/5 bilaterally and 4/5 distally.  There is 0/5 strength in tibialis anterior (no ankle dorsiflexion, inversion or eversion on the L after old injury).  The L leg is smaller than the R.  Plantar response was ? upgoing on the right and neutral on the L.  There are rare fasciculations in vastus medialis in the bilateral LE.  Movement examination: Tone: There is normal tone in the bilateral upper extremities.  There is some spasticity in the LLE but no rigidity.  Tone in the RLE is normal to decreased.  Abnormal movements: No tremor.  There are fasicuclations as above.  No dyskinesia.  Coordination:  There is no decremation with RAM's, in any form of RAMs including alternating supination and pronation of the forearm, hand opening and closing, finger taps, heel taps and toe taps. Gait and Station: The patient has significant difficulty arising out of a deep-seated chair without the use of the hands. He requires the help of 2 people and a walker.  He can then walk with the walker alone but is short stepped and drags the right leg.  Labs:  Labs:  B12, Tsh, Free T4, SPEP and UPEP with immunofixation, calcium, phosphorous, PTH, Copper, Lyme, Heavy metal screen      ASSESSMENT/PLAN:  1.  Weakness and gait changes  -Given fairly  widespread fasiculations, hyperreflexia in combination with falls, motor neuron disease is high among the list of Ddx.  I told him and his family that I felt confident that he did  not have PD and see no evidence of parkinsonism.  We talked about safety.  I will send him for EMG as well as the labs above.  I will refer him to my partner, Dr. Posey Pronto, a neuromuscular specialist after the EMG.  Greater than 50% of 75 min visit in counseling with the patient and family.

## 2014-01-19 LAB — B. BURGDORFI ANTIBODIES: B burgdorferi Ab IgG+IgM: 0.32 {ISR}

## 2014-01-19 LAB — PARATHYROID HORMONE, INTACT (NO CA): PTH: 22 pg/mL (ref 14–64)

## 2014-01-20 LAB — COPPER, SERUM: Copper: 81 ug/dL (ref 70–175)

## 2014-01-20 LAB — SPEP & IFE WITH QIG
ALBUMIN ELP: 59.5 % (ref 55.8–66.1)
ALPHA-1-GLOBULIN: 4.9 % (ref 2.9–4.9)
ALPHA-2-GLOBULIN: 7.4 % (ref 7.1–11.8)
BETA 2: 5.8 % (ref 3.2–6.5)
Beta Globulin: 7.2 % (ref 4.7–7.2)
GAMMA GLOBULIN: 15.2 % (ref 11.1–18.8)
IgA: 191 mg/dL (ref 68–379)
IgG (Immunoglobin G), Serum: 1150 mg/dL (ref 650–1600)
IgM, Serum: 68 mg/dL (ref 41–251)
Total Protein, Serum Electrophoresis: 6.6 g/dL (ref 6.0–8.3)

## 2014-01-20 LAB — HEAVY METALS SCREEN, URINE

## 2014-01-21 ENCOUNTER — Ambulatory Visit (INDEPENDENT_AMBULATORY_CARE_PROVIDER_SITE_OTHER): Payer: Commercial Managed Care - HMO | Admitting: Neurology

## 2014-01-21 ENCOUNTER — Other Ambulatory Visit: Payer: Self-pay | Admitting: Neurology

## 2014-01-21 DIAGNOSIS — G1220 Motor neuron disease, unspecified: Secondary | ICD-10-CM

## 2014-01-21 DIAGNOSIS — R292 Abnormal reflex: Secondary | ICD-10-CM

## 2014-01-21 DIAGNOSIS — G1221 Amyotrophic lateral sclerosis: Secondary | ICD-10-CM

## 2014-01-21 NOTE — Procedures (Signed)
[facility]  [address], [address]  [place], [place] [address] Tel: [phone] Fax:  [phone] Test Date:  01/21/2014  Patient: Brandon Smith DOB: 03/08/1933 Physician: Narda Amber, DO  Sex: Male Height: 5\' 11"  Ref Phys: Alonza Bogus  ID#: [id number] Temp: 34.2C Technician: Laureen Ochs R. NCS T.   Patient Complaints: This is a 80 year-old gentleman presenting for evaluation of progressive bilateral lower extremity weakness, gait instability, and muscle twitches.   NCV & EMG Findings: Extensive electrodiagnostic testing of the left upper extremity, midthoracic paraspinal muscles (T6 and T10), right lower extremity, and bulbar muscles shows the following: 1. Median and radial sensory responses are within normal limits. The ulnar sensory response is prolonged (3.9 ms) with preserved amplitude. 2. Absent sural and superficial peroneal sensory responses. 3. Median motor response is within normal limits. The ulnar motor response recording at the abductor digiti minimi is reduced in amplitude (5.0 mV) , and normal recording at the first dorsal interosseous muscle. 4. Peroneal motor response is essentially absent at the extensor digitorum brevis, however within normal limits at the tibialis anterior. The tibial motor response is also significantly reduced in amplitude. 5. In the upper extremity, chronic motor axon loss changes are seen affecting nearly all the tested muscles, with active changes involving the deltoid, first dorsal interosseous, and abductor pollicis brevis muscles. Fasciculation potentials are active and seen in 5 of 7 tested muscles. 6. In the lower extremity, chronic motor axon loss changes are seen affecting nearly all the tested muscles, with active changes involving the rectus femoris and tibialis anterior. Fasciculation potentials are seen in 2 of 5 tested muscles. 7. There is no active or chronic motor axon loss changes affecting the midthoracic paraspinal  muscles. 8. There is no active or chronic motor axon loss changes affecting the mentalis and hyoglossus muscles.  Impression:  1. The electrophysiologic findings are most consistent with a generalized sensorimotor polyneuropathy, predominantly axon loss in type. Overall, these findings are moderate in degree electrically. 2. There is also evidence of a superimposed multilevel active on chronic intraspinal canal lesions (i.e. radiculopathy, anterior horn cell) affecting the cervical and lumbosacral.  The pattern of involvement can be seen in an evolving widespread disorder of the anterior horn cells; however, based on electrodiagnostic findings alone, the changes are insufficient for the definitive electrodiagnosis of amyotrophic lateral sclerosis.     ___________________________ Narda Amber, DO    Nerve Conduction Studies Anti Sensory Summary Table   Site NR Peak (ms) Norm Peak (ms) P-T Amp (V) Norm P-T Amp  Left Median Anti Sensory (2nd Digit)  Wrist    3.7 <3.8 15.7 >10  Left Radial Anti Sensory (Base 1st Digit)  Wrist    2.7 <2.8 16.5 >10  Right Sup Peroneal Anti Sensory (Ant Lat Mall)  12 cm NR  <4.6  >3  Right Sural Anti Sensory (Lat Mall)  Calf NR  <4.6  >3  Left Ulnar Anti Sensory (5th Digit)  Wrist    3.9 <3.2 8.5 >5   Motor Summary Table   Site NR Onset (ms) Norm Onset (ms) O-P Amp (mV) Norm O-P Amp Site1 Site2 Delta-0 (ms) Dist (cm) Vel (m/s) Norm Vel (m/s)  Left Median Motor (Abd Poll Brev)  Wrist    3.7 <4.0 9.5 >5 Elbow Wrist 5.4 28.0 52 >50  Elbow    9.1  8.6         Right Peroneal Motor (Ext Dig Brev)  Ankle    5.0 <  6.0 0.7 >2.5 B Fib Ankle 8.8 35.0 40 >40  B Fib    13.8  0.7  Poplt B Fib 3.0 10.0 33 >40  Poplt    16.8  0.7         Post exercise    4.4  0.7         Right Peroneal TA Motor (Tib Ant)  Fib Head    3.6 <4.5 3.6 >3 Poplit Fib Head 2.0 8.5 43 >40  Poplit    5.6  3.2         Right Tibial Motor (Abd Hall Brev)    Edema  Ankle    4.9 <6.0 1.5 >4  Knee Ankle 12.3 45.0 37 >40  Knee    17.2  0.8         Site 3    4.7  1.5         Left Ulnar Motor (Abd Dig Minimi)  Wrist    2.9 <3.1 5.0 >7 B Elbow Wrist 4.2 22.0 52 >50  B Elbow    7.1  4.5  A Elbow B Elbow 1.8 10.0 56 >50  A Elbow    8.9  4.1         Left Ulnar (FDI) Motor (1st DI)  Wrist    4.2 <4.5 8.1 >7 B Elbow Wrist 0.0 0.0  >50  B Elbow    4.2  8.3          F Wave Studies   NR F-Lat (ms) Lat Norm (ms) L-R F-Lat (ms)  Left Ulnar (Mrkrs) (Abd Dig Min)     31.77 <33    EMG   Side Muscle Ins Act Fibs Psw Fasc Number Recrt Dur Dur. Amp Amp. Poly Poly. Comment  Right AntTibialis Nml 1+ Nml 1+ 2- Rapid Most 1+ All 1+ Most 1+ N/A  Right Gastroc Nml Nml Nml Nml 1- Mod-V Some 1+ Some 1+ Nml Nml N/A  Right Flex Dig Long Nml Nml Nml Nml 2- Rapid Many 1+ Many 1+ Nml Nml N/A  Right RectFemoris Nml 1+ Nml 1+ 2- Rapid Many 1+ Many 2+ Some 1+ N/A  Right GluteusMed Nml Nml Nml Nml 1- Mod-R Some 1+ Some 1+ Nml Nml N/A  Right T6 Parasp Nml Nml Nml Nml NE - - - - - - - N/A  Right T10 Parasp Nml Nml Nml Nml NE - - - - - - - N/A  Left 1stDorInt Nml 1+ Nml 1+ 1- Rapid Some 1+ Some 1+ Nml Nml N/A  Left Abd Poll Brev Nml 1+ Nml 1+ 2- Rapid Some 1+ Some 1+ Nml Nml N/A  Left FlexPolLong Nml Nml Nml Nml 1- Mod-R Some 1+ Nml Nml Nml Nml N/A  Left PronatorTeres Nml Nml Nml Nml 1- Mod-R Some 1+ Nml Nml Nml Nml N/A  Left Biceps Nml Nml Nml 2+ 1- Mod-R Some 1+ Few 1+ Nml Nml N/A  Left Triceps Nml Nml Nml 3+ 2- Rapid Some 1+ Some 1+ Nml Nml N/A  Left Deltoid Nml Nml 1+ 1+ 2- Rapid Some 1+ Nml Nml Nml Nml N/A  Left Mentalis Nml Nml Nml Nml Nml Nml Nml Nml Nml Nml Nml Nml N/A  Left Hyoglossus Nml Nml Nml Nml Nml Nml Nml Nml Nml Nml Nml Nml N/A      Waveforms:

## 2014-01-26 ENCOUNTER — Ambulatory Visit (INDEPENDENT_AMBULATORY_CARE_PROVIDER_SITE_OTHER): Payer: Commercial Managed Care - HMO | Admitting: Neurology

## 2014-01-26 ENCOUNTER — Encounter: Payer: Self-pay | Admitting: Neurology

## 2014-01-26 VITALS — BP 130/68 | HR 54 | Ht 71.0 in

## 2014-01-26 DIAGNOSIS — Z7189 Other specified counseling: Secondary | ICD-10-CM

## 2014-01-26 DIAGNOSIS — G63 Polyneuropathy in diseases classified elsewhere: Secondary | ICD-10-CM

## 2014-01-26 DIAGNOSIS — G1221 Amyotrophic lateral sclerosis: Secondary | ICD-10-CM | POA: Insufficient documentation

## 2014-01-26 HISTORY — DX: Amyotrophic lateral sclerosis: G12.21

## 2014-01-26 LAB — UIFE/LIGHT CHAINS/TP QN, 24-HR UR
ALBUMIN, U: DETECTED
Alpha 1, Urine: DETECTED — AB
Alpha 2, Urine: DETECTED — AB
BETA UR: DETECTED — AB
Gamma Globulin, Urine: DETECTED — AB

## 2014-01-26 NOTE — Progress Notes (Signed)
[facility] Clinic Note - Initial Visit   Date: 01/26/2014  [name] MRN: [id number] DOB: [birth year]   Dear Dr. Carles Collet:  Thank you for your kind referral of Brandon Smith for consultation of bilateral leg weakness. Although his history is well known to you, please allow [place] to reiterate it for the purpose of our medical record. The patient was accompanied to the clinic by daughter-in-law and wife who also provides collateral information.     History of Present Illness: Brandon Smith is a 80 y.o. right-handed Caucasian male with history of CAD s/p stent and CABG, hypertension, hyperlipidemia, lung cancer s/p lobectomy of right upper lung (1997), peripheral vascular disease, and back surgery x 5, presenting for evaluation of bilateral leg weakness.    In 1997, he underwent back surgery due to lumbar radiculopathy which alleviated the pain.  He had two similar bouts of low back pain in mid-2000s and underwent lumbar surgery again, which helped with the pain.  He was able to get back to his usual activities and was walking independently.  In 1999, he had a fall and suffered injured his left leg after falling off a ladder.  He injured his peroneal nerve and had left foot drop since then.  During the summer of 2013, he recalls doing a lot of walking with his wife at the mall and noticed that his legs would tire quicker than previously.  In 2014, he started using a cane.  He has fall about dozen times over the past year and when he does fall, he cannot stand to raise himself.  He was referred to see Dr. Carloyn [facility] in [place]), neurosurgery, who recommended lumbar fusion to help the weakness of his legs.  There was no associated numbness or back pain.  He underwent surgery, but symptoms did not improve. Around 2014, his weakness progressed to the point where he was unable stand independently.  He is currently walking with assistance of a walker at home and requires help  from his wife for transfers, bathing, and dressing.  He complains burning pain of the lower legs bilaterally.  He initially saw Dr. Jannifer Franklin in September 2014 for his weakness and paresthesias.  He underwent EMG of the legs which showed severe, end-stage left peroneal neuropathy and multilevel lumbosacral radiculopathy.  He also sought medical opinion at [facility] by Dr. Marvel Plan (ortho) who did not feel symptoms were amenable to surgeical intervention to referred to Dr. Radford Pax who was concerned about akinetic parkinsonism, so referred the patient to Dr. Carles Collet.  Dr. Carles Collet did not find evidence to support the diagnosis of parkinson's disease and due to observation of fasciculations on exam, was highly concerned about motor neuron disease and subsequently ordered EMG and asked patient to follow-up with me.  Currently, patient's biggest symptoms are bilateral leg weakness.  He denies any problems swallowing or talking, or shortness of breath. He does not endorse significant arm weakness. Also over the past year, he has developed cramps of the hands.     Past Medical History  Diagnosis Date  . Peroneal nerve palsy     left after fall off ladder  . Hypertension   . Foot drop, left     chronic  . Lumbar spondylosis   . CAD (coronary artery disease)   . Cancer     lung  . Peripheral vascular disease   . Hearing deficit     right hearing aid  . Abnormality of gait 01/28/2013  .  Polyneuropathy in other diseases classified elsewhere 01/28/2013  . Rosacea   . Dyslipidemia   . Osteopenia   . Lumbosacral radiculopathy 03/27/2013    Past Surgical History  Procedure Laterality Date  . Back surgery  2008,2010, 2013, 2014    lumbosacral  . Spinal cord stimulator implant      and removal in 04/2012  . Lung lobectomy  1997  . Cardiac catheterization    . Coronary stent placement      X4  . Coronary artery bypass graft       Medications:  Current Outpatient Prescriptions on File Prior to Visit    Medication Sig Dispense Refill  . alendronate (FOSAMAX) 70 MG tablet Take 70 mg by mouth once a week. Saturday      . aspirin 81 MG tablet Take 81 mg by mouth daily.      . Calcium-Vitamin D 600-200 MG-UNIT per tablet Take by mouth.      . carvedilol (COREG) 12.5 MG tablet Take 12.5 mg by mouth Twice daily.      . clopidogrel (PLAVIX) 75 MG tablet Take 75 mg by mouth Daily.      Mariane Baumgarten Calcium (STOOL SOFTENER PO) Take by mouth daily.      Marland Kitchen doxycycline (PERIOSTAT) 20 MG tablet Take 20 mg by mouth daily.      Marland Kitchen EAR WAX REMOVAL DROPS 6.5 % otic solution       . furosemide (LASIX) 20 MG tablet Take 20 mg by mouth Daily.      Marland Kitchen gabapentin (NEURONTIN) 300 MG capsule Take 300 mg by mouth daily.      Marland Kitchen losartan (COZAAR) 100 MG tablet Take 100 mg by mouth Daily.      . metroNIDAZOLE (METROCREAM) 0.75 % cream Apply 7.86 application topically daily.      . minocycline (DYNACIN) 75 MG tablet once daily.      . minocycline (DYNACIN) 75 MG tablet       . NITROSTAT 0.4 MG SL tablet Take 0.4 mg by mouth as needed.      . pantoprazole (PROTONIX) 40 MG tablet Take 40 mg by mouth daily.      . polyethylene glycol (MIRALAX / GLYCOLAX) packet Take by mouth.      . rosuvastatin (CRESTOR) 10 MG tablet Take 10 mg by mouth daily.      . Zinc 100 MG TABS Take 100 mg by mouth daily.       No current facility-administered medications on file prior to visit.    Allergies:  Allergies  Allergen Reactions  . Altace [Ramipril] Rash    Family History: Family History  Problem Relation Age of Onset  . Cancer Mother   . Heart disease Mother   . Hyperlipidemia Mother   . Heart attack Mother   . Heart disease Father     before age 60  . Heart attack Father   . Cancer Sister     breast  . Hyperlipidemia Sister   . Hypertension Sister   . Heart attack Sister   . Cancer Brother     lung  . Heart disease Brother   . Hyperlipidemia Son     Social History: History   Social History  . Marital Status:  Married    Spouse Name: N/A    Number of Children: 3  . Years of Education: HS   Occupational History  . retired     Data processing manager; computers   Social History Main Topics  .  Smoking status: Former Smoker -- 1.00 packs/day for 20 years    Types: Cigarettes    Quit date: 07/19/1980  . Smokeless tobacco: Never Used  . Alcohol Use: No  . Drug Use: No  . Sexual Activity: Not on file   Other Topics Concern  . Not on file   Social History Narrative   He lives with wife in a one-story home.   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   Highest level of education:  2 years of college    Review of Systems:  CONSTITUTIONAL: No fevers, chills, night sweats, or weight loss.   EYES: No visual changes or eye pain ENT: No hearing changes.  No history of nose bleeds.   RESPIRATORY: No cough, wheezing and shortness of breath.   CARDIOVASCULAR: Negative for chest pain, and palpitations.   GI: Negative for abdominal discomfort, blood in stools or black stools.  No recent change in bowel habits.   GU:  No history of incontinence.   MUSCLOSKELETAL: No history of joint pain or swelling.  No myalgias.   SKIN: Negative for lesions, rash, and itching.   HEMATOLOGY/ONCOLOGY: Negative for prolonged bleeding, bruising easily, and swollen nodes.  +history of cancer.   ENDOCRINE: Negative for cold or heat intolerance, polydipsia or goiter.   PSYCH:  No depression or anxiety symptoms.   NEURO: As Above.   Vital Signs:  BP 130/68  Pulse 54  Ht 5\' 11"  (1.803 m)  SpO2 97%   General Medical Exam:   General:  Well appearing, comfortable.   Eyes/ENT: see cranial nerve examination.   Neck: No masses appreciated.  Full range of motion without tenderness.   Respiratory:  Clear to auscultation, good air entry bilaterally.   Cardiac:  Regular rate and rhythm, no murmur.   Extremities:  No deformities, edema, or skin discoloration. Good capillary refill.   Skin:  Skin color,  texture, turgor normal. No rashes or lesions.  Neurological Exam: MENTAL STATUS including orientation to time, place, person, recent and remote memory, attention span and concentration, language, and fund of knowledge is normal.  Speech is not dysarthric, slightly soft voice  CRANIAL NERVES: II:  No visual field defects.  Unremarkable fundi.   III-IV-VI: Pupils equal round and reactive to light.  Normal conjugate, extra-ocular eye movements in all directions of gaze.  No nystagmus.  No ptosis.   V:  Normal facial sensation.  Jaw jerk is present.   VII:  Normal facial symmetry and movements.  Facial muscles are intact. Snout, Myerson's, and bilateral palmomental reflexes present. VIII:  Normal hearing and vestibular function.   IX-X:  Normal palatal movement.   XI:  Normal shoulder shrug and head rotation.   XII:  Normal tongue strength and range of motion, no deviation or fasciculation.  MOTOR:  Bilateral lower extremity atrophy (L>R), active fasciculations over the proximal upper extremities.  No pronator drift.  Tone is normal.    Right Upper Extremity:    Left Upper Extremity:    Deltoid  5/5   Deltoid  5/5   Biceps  5/5   Biceps  5/5   Triceps  4+/5   Triceps  4+/5   Wrist extensors  5/5   Wrist extensors  5/5   Wrist flexors  5/5   Wrist flexors  5/5   Finger extensors  5/5   Finger extensors  5/5   Finger flexors  5/5   Finger flexors  5/5   Dorsal  interossei  4+/5   Dorsal interossei  4+/5   Abductor pollicis  4+/5   Abductor pollicis  4+/5   Tone (Ashworth scale)  0  Tone (Ashworth scale)  0   Right Lower Extremity:    Left Lower Extremity:    Hip flexors  4/5   Hip flexors  4/5   Hip extensors  4+/5   Hip extensors  4+/5   Knee flexors  5-/5   Knee flexors  5-/5   Knee extensors  4+/5   Knee extensors  4+/5   Dorsiflexors  4/5   Dorsiflexors  0/5   Plantarflexors  4/5   Plantarflexors  3/5   Toe extensors  4/5   Toe extensors  0/5   Toe flexors  4/5   Toe flexors  2/5     Tone (Ashworth scale)  0  Tone (Ashworth scale)  0   MSRs:  Right                                                                 Left brachioradialis 3+  brachioradialis 3+  biceps 3+  biceps 3+  triceps 3+  triceps 3+  patellar 3+  patellar 3+  ankle jerk 0  ankle jerk 0  Hoffman no  Hoffman no  plantar response up  plantar response mute  Bilateral crossed adductors. Vertical spread with patella testing. Medial pectoralis spreads to finger flexors.  SENSORY:  Reduced pin prick and vibration at ankles bilaterally.     COORDINATION/GAIT: Normal finger-to- nose-finger. Intact rapid alternating movements bilaterally.  Unable to rise from a chair without using arms.  Gait not assessed as patient in wheelchair and too weak to stand.   Data: Labs 01/18/2014:  SPEP/UPEP with IFE no M protein, vitamin B12 397, copper 81, Lyme neg, PTH 22, TSH 1.9 Labs 2014:   AChR binding antibody neg, VGCC antibody neg, RF neg, ACE, eg ANA neg, RPR neg, CK 187   MRI cervical spine wo contrast 04/10/2013:  Abnormal MRI scan of cervical spine showing mild spondylytic changes at C5-6 and C6-7 with disc flattening and by foraminal narrowing but no definite compression  MRI brain wo contrast 04/10/2013:  Abnormal MRI scan of the brain showing moderate changes of chronic microvascular ischemia and generalized cerebral atrophy. Overall no significant change compared with CT head dated 01/20/2013.   EMG 01/21/2014: 1. The electrophysiologic findings are most consistent with a generalized sensorimotor polyneuropathy, predominantly axon loss in type. Overall, these findings are moderate in degree electrically. 2. There is also evidence of a superimposed multilevel active on chronic intraspinal canal lesions (i.e. radiculopathy, anterior horn cell) affecting the cervical and lumbosacral. The pattern of involvement can be seen in an evolving widespread disorder of the anterior horn cells; however, based on electrodiagnostic  findings alone, the changes are insufficient for the definitive electrodiagnosis of amyotrophic lateral sclerosis.    IMPRESSION: Mr. Hayashida is a 80 year-old gentleman presenting for evaluation of progressive painless bilateral lower extremity weakness and fasciculations.  His neurological examination shows upper motor neuron findings involving the bulbar, cervical, and lumbosacral region with lower motor neuron dysfunction in the cervical and lumbosacral segments.    I have reviewed his imaging, serology testing, and electrodiagnostic testing.  There is no structural  abnormalities to explain his symptomology.  Based on his EMG, he had active on chronic motor axon loss changes affecting the cervical and lumbosacral regions in addition to a generalized peripheral neuropathy.  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  I had an extensive discussion regarding the pathophysiology, etiology, natural course, and management of motor neuron disease with patient, his wife, and daughter-in-law.  At this time, his biggest symptom is with leg weakness so for home safety and assistance with ADLs, I will place home health referral.  There is no difficulty with swallowing or breathing currently.  I did discuss Advanced Directions including trach/PEG which patient would like to discuss with his wife/family.  He currently does not have one set up and has been provided with information.  I offered riluzole as it is the only FDA approved medication shown to increase survival in ALS, and patient would like to discuss with family first.  Return to clinic in 3-4 weeks.   The duration of this appointment visit was 100 minutes of face-to-face time with the patient.  Greater than 50% of this time was spent in counseling, explanation of  diagnosis, planning of further management, and coordination of care.   Thank you for allowing me to participate in patient's care.  If I can answer any additional questions, I would be pleased to do so.    Sincerely,    Donika K. Posey Pronto, DO

## 2014-01-26 NOTE — Patient Instructions (Signed)
1.  Information provided on Advanced Directives 2.  Home health referral  3.  Please discuss with your family whether you would like to start the medication, Rilutek 4.  Return to clinic in 3-4 weeks

## 2014-01-27 ENCOUNTER — Telehealth: Payer: Self-pay | Admitting: Neurology

## 2014-01-27 NOTE — Telephone Encounter (Signed)
Spoke with Arbie Cookey and she said that she has her parents on a waiting list for independent living apartment.  Informed her that I am referring him to home health today for an evaluation.  Requested for her to come to his October 9 appointment.  She said that she and her brothers will be here.

## 2014-01-27 NOTE — Telephone Encounter (Signed)
Brandon Smith, Pt's daughter called requesting to speak to a nurse regarding his diagnosis.  Please call her back at work #[phone]

## 2014-01-27 NOTE — Progress Notes (Signed)
Note faxed.

## 2014-01-29 LAB — HEAVY METALS SCREEN, URINE
Arsenic, 24H Ur: 2
Mercury 24 Hr Urine: <2

## 2014-02-04 ENCOUNTER — Encounter: Payer: Self-pay | Admitting: Neurology

## 2014-02-26 ENCOUNTER — Encounter: Payer: Self-pay | Admitting: Neurology

## 2014-02-26 ENCOUNTER — Ambulatory Visit (INDEPENDENT_AMBULATORY_CARE_PROVIDER_SITE_OTHER): Payer: Commercial Managed Care - HMO | Admitting: Neurology

## 2014-02-26 VITALS — BP 120/80 | HR 62 | Ht 71.0 in | Wt 182.1 lb

## 2014-02-26 DIAGNOSIS — G1221 Amyotrophic lateral sclerosis: Secondary | ICD-10-CM

## 2014-02-26 DIAGNOSIS — G8220 Paraplegia, unspecified: Secondary | ICD-10-CM

## 2014-02-26 DIAGNOSIS — R269 Unspecified abnormalities of gait and mobility: Secondary | ICD-10-CM

## 2014-02-26 DIAGNOSIS — Z5181 Encounter for therapeutic drug level monitoring: Secondary | ICD-10-CM

## 2014-02-26 MED ORDER — RILUZOLE 50 MG PO TABS: ORAL_TABLET | ORAL | Status: DC

## 2014-02-26 NOTE — Patient Instructions (Addendum)
1.  Please find out if [facility] is in your network, if so, we will initiate referral to their [facility] 2.  Start rilutek 50mg  daily one tablet daily for one week, then take one tablet twice daily 3.  Check CBC and CMP monthly x 3 months 4.  Home PT referral and will look into additional home health care 5.  Return to clinic in in 3-4 months

## 2014-02-26 NOTE — Progress Notes (Signed)
Follow-up Visit   Date: 03/01/2014   Brandon Smith MRN: [id number] DOB: 1932/12/23   Interim History: Brandon Smith is a 80 y.o. right-handed Caucasian male with history of CAD s/p stent and CABG, hypertension, hyperlipidemia, lung cancer s/p lobectomy of right upper lung (1997), peripheral vascular disease, and back surgery x 5, returning to the clinic for follow-up of ALS.  The patient was accompanied to the clinic by wife, daughter, and sons who also provides collateral information.    History of present illness: In 1997, he underwent back surgery due to lumbar radiculopathy which alleviated the pain. He had two similar bouts of low back pain in mid-2000s and underwent lumbar surgery again, which helped with the pain. He was able to get back to his usual activities and was walking independently. In 1999, he had a fall and suffered injured his left leg after falling off a ladder. He injured his peroneal nerve and had left foot drop since then.   During the summer of 2013, he recalls doing a lot of walking with his wife at the mall and noticed that his legs would fatigue quicker than previously. In 2014, he started using a cane. He has fall about dozen times over the past year and when he does fall, he cannot stand to raise himself. He was referred to see Dr. Carloyn Manner [facility] in [place]), neurosurgery, who recommended lumbar fusion to help his leg weakness. There was no associated numbness or back pain. He underwent surgery, but symptoms did not improve. Around 2014, his weakness progressed to the point where he was unable stand independently. He is currently walking with assistance of a walker at home and requires help from his wife for transfers, bathing, and dressing.   He complains burning pain of the lower legs bilaterally. He initially saw Dr. Jannifer Franklin, neurologist at [facility], in September 2014 for his weakness and paresthesias. EMG of the legs showed severe, end-stage left peroneal  neuropathy and multilevel lumbosacral radiculopathy. He also sought medical opinion at [facility] by Dr. Marvel Plan (ortho) who did not feel symptoms were amenable to surgical intervention.  Along his course, he was referred to Dr. Radford Pax, surgeon, who was concerned about akinetic parkinsonism, so referred the patient to Dr. Carles Collet, Movement disorder specialist at our clinic. Dr. Carles Collet did not find evidence to support the diagnosis of parkinson's disease and due to observation of fasciculations on exam, was highly concerned about motor neuron disease and subsequently ordered EMG and asked patient to follow-up with me.   UPDATE 02/26/2014:  Patient was last seen in the office on 01/26/2014 at which time the diagnosis of clinically probable ALS was discussed based on the fact that there was evidence of active and chronic motor axon loss changes affecting the cervical and lumbosacral region in addition to clinical examination changes supporting the diagnosis of motor neuron disease.  Currently, patient's biggest symptoms are bilateral leg weakness. He denies any problems swallowing or talking, or shortness of breath. He does not endorse significant arm weakness. Also over the past year, he has developed cramps of the hands.   He had one interval fall in the shower.  He was sitting on the shower seat, but slipped in the bathroom.  He has suffered bruises, but no significant injuries. He is getting home nursing, but they are only checking vital signs once per week and not helping with ADLs.     Medications:  Current Outpatient Prescriptions on File Prior to Visit  Medication Sig  Dispense Refill  . alendronate (FOSAMAX) 70 MG tablet Take 70 mg by mouth once a week. Saturday      . aspirin 81 MG tablet Take 81 mg by mouth daily.      . Calcium-Vitamin D 600-200 MG-UNIT per tablet Take by mouth.      . carvedilol (COREG) 12.5 MG tablet Take 12.5 mg by mouth Twice daily.      . clopidogrel (PLAVIX) 75 MG tablet Take 75 mg by  mouth Daily.      Mariane Baumgarten Calcium (STOOL SOFTENER PO) Take by mouth daily.      Marland Kitchen doxycycline (PERIOSTAT) 20 MG tablet Take 20 mg by mouth daily.      Marland Kitchen EAR WAX REMOVAL DROPS 6.5 % otic solution       . furosemide (LASIX) 20 MG tablet Take 20 mg by mouth Daily.      Marland Kitchen gabapentin (NEURONTIN) 300 MG capsule Take 300 mg by mouth daily.      Marland Kitchen losartan (COZAAR) 100 MG tablet Take 100 mg by mouth Daily.      . metroNIDAZOLE (METROCREAM) 0.75 % cream Apply 7.90 application topically daily.      . minocycline (DYNACIN) 75 MG tablet once daily.      Marland Kitchen NITROSTAT 0.4 MG SL tablet Take 0.4 mg by mouth as needed.      . polyethylene glycol (MIRALAX / GLYCOLAX) packet Take by mouth.      . rosuvastatin (CRESTOR) 10 MG tablet Take 10 mg by mouth daily.       No current facility-administered medications on file prior to visit.    Allergies:  Allergies  Allergen Reactions  . Altace [Ramipril] Rash     Review of Systems:  CONSTITUTIONAL: No fevers, chills, night sweats, or weight loss.   EYES: No visual changes or eye pain ENT: No hearing changes.  No history of nose bleeds.   RESPIRATORY: No cough, wheezing and shortness of breath.   CARDIOVASCULAR: Negative for chest pain, and palpitations.   GI: Negative for abdominal discomfort, blood in stools or black stools.  No recent change in bowel habits.   GU:  No history of incontinence.   MUSCLOSKELETAL: No history of joint pain or swelling.  No myalgias.   SKIN: Negative for lesions, rash, and itching.   ENDOCRINE: Negative for cold or heat intolerance, polydipsia or goiter.   PSYCH:  no depression or anxiety symptoms.   NEURO: As Above.   Vital Signs:  BP 120/80  Pulse 62  Ht 5\' 11"  (1.803 m)  Wt 182 lb 2 oz (82.611 kg)  BMI 25.41 kg/m2  SpO2 96%  Neurological Exam: MENTAL STATUS including orientation to time, place, person, recent and remote memory, attention span and concentration, language, and fund of knowledge is normal.  Speech  is not dysarthric.  CRANIAL NERVES:  Pupils equal round and reactive to light.  Normal conjugate, extra-ocular eye movements in all directions of gaze.  No ptosis.  Face is symmetric. Facial muscles are intact. Snout, Myerson's, and bilateral palmomental reflexes present. Palate elevates symmetrically.  Tongue is midline.    MOTOR:  Bilateral lower extremity atrophy (L>R), active fasciculations over the extremities (arms > legs). No pronator drift. Tone is normal.   Right Upper Extremity:    Left Upper Extremity:    Deltoid  5/5   Deltoid  5/5   Biceps  5/5   Biceps  5/5   Triceps  4+/5   Triceps  4+/5  Wrist extensors  5/5   Wrist extensors  5/5   Wrist flexors  5/5   Wrist flexors  5/5   Finger extensors  5/5   Finger extensors  5/5   Finger flexors  5/5   Finger flexors  5/5   Dorsal interossei  4+/5   Dorsal interossei  4+/5   Abductor pollicis  4+/5   Abductor pollicis  4+/5   Tone (Ashworth scale)  0   Tone (Ashworth scale)  0    Right Lower Extremity:    Left Lower Extremity:    Hip flexors  4/5   Hip flexors  4/5   Hip extensors  4+/5   Hip extensors  4+/5   Knee flexors  5-/5   Knee flexors  5-/5   Knee extensors  4+/5   Knee extensors  4+/5   Dorsiflexors  4/5   Dorsiflexors  0/5   Plantarflexors  4/5   Plantarflexors  3/5   Toe extensors  4/5   Toe extensors  0/5   Toe flexors  4/5   Toe flexors  2/5   Tone (Ashworth scale)  0   Tone (Ashworth scale)  0    MSRs:  Right      Left  brachioradialis  3+   brachioradialis  3+   biceps  3+   biceps  3+   triceps  3+   triceps  3+   patellar  3+   patellar  3+   ankle jerk  0   ankle jerk  0   Hoffman  no   Hoffman  no   plantar response  up   plantar response  mute   Bilateral crossed adductors. Vertical spread with patella testing. Medial pectoralis spreads to finger flexors.   SENSORY: Reduced pin prick and vibration at ankles bilaterally.   COORDINATION/GAIT: Unable to rise from a chair without using arms. Gait not  assessed as patient in wheelchair and too weak to stand.    Data: Labs 01/18/2014: SPEP/UPEP with IFE no M protein, vitamin B12 397, copper 81, Lyme neg, PTH 22, TSH 1.9   Labs 2014: AChR binding antibody neg, VGCC antibody neg, RF neg, ACE, eg ANA neg, RPR neg, CK 187   MRI cervical spine wo contrast 04/10/2013: Abnormal MRI scan of cervical spine showing mild spondylytic changes at C5-6 and C6-7 with disc flattening and by foraminal narrowing but no definite compression   MRI brain wo contrast 04/10/2013: Abnormal MRI scan of the brain showing moderate changes of chronic microvascular ischemia and generalized cerebral atrophy. Overall no significant change compared with CT head dated 01/20/2013.   EMG 01/21/2014:  1. The electrophysiologic findings are most consistent with a generalized sensorimotor polyneuropathy, predominantly axon loss in type. Overall, these findings are moderate in degree electrically. 2. There is also evidence of a superimposed multilevel active on chronic intraspinal canal lesions (i.e. radiculopathy, anterior horn cell) affecting the cervical and lumbosacral. The pattern of involvement can be seen in an evolving widespread disorder of the anterior horn cells; however, based on electrodiagnostic findings alone, the changes are insufficient for the definitive electrodiagnosis of amyotrophic lateral sclerosis.    IMPRESSION: Mr. Albea is a 80 year-old gentleman returning to the clinic for follow-up of clinically probable ALS.  Symptoms started in summer of 2013 with progressive painless bilateral lower extremity weakness. His neurological examination shows upper motor neuron findings involving the bulbar, cervical, and lumbosacral region with lower motor neuron dysfunction in the cervical and  lumbosacral segments.   I have reviewed his imaging, serology testing, and electrodiagnostic testing. There is no structural abnormalities to explain his symptomology. Based on his EMG,  he had active on chronic motor axon loss changes affecting the cervical and lumbosacral regions in addition to a generalized peripheral neuropathy. 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  Of note, he has known left peroneal nerve injury from a fall and is unrelated to his current presentation.  I had an extensive discussion regarding the pathophysiology, etiology, natural course, and management of motor neuron disease with patient and his family.  At this time, his biggest symptom is with leg weakness so for home safety and assistance with ADLs, so I will look into getting additional resources at home through home health.  I offered riluzole as it is the only FDA approved medication which she would like to start. We will get baseline CBC and CMP and checked monthly for the next 3 months and every 3 months thereafter.  The family would like to seek a second opinion for the diagnosis. Currently, he has an appointment at [facility] in January. I offered [facility] as an alternative, since they may be able to be evaluated sooner which they would like.   PLAN/RECOMMENDATIONS:  1.  Referral to [facility] at [facility]  2.  Start rilutek 50mg  daily one tablet daily for one week, then take one tablet twice daily 3.  Check CBC and CMP monthly x 3 months 4.  Referral for Home PT and additional home health care 5.  Return to clinic in in 3-4 months   The duration of this appointment visit was 60 minutes of face-to-face time with the patient.  Greater than 50% of this time was spent in counseling, explanation of diagnosis, planning of further management, and coordination of care.   Thank you for allowing me to participate in patient's care.  If I can answer any additional  questions, I would be pleased to do so.    Sincerely,    Donika K. Posey Pronto, DO

## 2014-02-27 LAB — COMPREHENSIVE METABOLIC PANEL
ALT: 26 U/L (ref 0–53)
AST: 30 U/L (ref 0–37)
Albumin: 4.2 g/dL (ref 3.5–5.2)
Alkaline Phosphatase: 46 U/L (ref 39–117)
BILIRUBIN TOTAL: 0.6 mg/dL (ref 0.2–1.2)
BUN: 20 mg/dL (ref 6–23)
CALCIUM: 9.9 mg/dL (ref 8.4–10.5)
CHLORIDE: 103 meq/L (ref 96–112)
CO2: 24 meq/L (ref 19–32)
CREATININE: 1.21 mg/dL (ref 0.50–1.35)
GLUCOSE: 102 mg/dL — AB (ref 70–99)
Potassium: 4.5 mEq/L (ref 3.5–5.3)
Sodium: 140 mEq/L (ref 135–145)
Total Protein: 7 g/dL (ref 6.0–8.3)

## 2014-02-27 LAB — CBC
HCT: 38.4 % — ABNORMAL LOW (ref 39.0–52.0)
HEMOGLOBIN: 12.9 g/dL — AB (ref 13.0–17.0)
MCH: 29.1 pg (ref 26.0–34.0)
MCHC: 33.6 g/dL (ref 30.0–36.0)
MCV: 86.7 fL (ref 78.0–100.0)
PLATELETS: 141 10*3/uL — AB (ref 150–400)
RBC: 4.43 MIL/uL (ref 4.22–5.81)
RDW: 13.9 % (ref 11.5–15.5)
WBC: 4.8 10*3/uL (ref 4.0–10.5)

## 2014-03-01 ENCOUNTER — Telehealth: Payer: Self-pay | Admitting: Neurology

## 2014-03-01 ENCOUNTER — Telehealth: Payer: Self-pay | Admitting: *Deleted

## 2014-03-01 NOTE — Telephone Encounter (Signed)
Pt calling about ALS meds. Please call [phone] / Sherri S.

## 2014-03-01 NOTE — Telephone Encounter (Signed)
Called to request extension on nursing.  Also ordered PT, OT and nursing aid.

## 2014-03-01 NOTE — Telephone Encounter (Signed)
Patient just wants [facility] to order enough for 1 month and 1 refill since it is so expensive.  If medication is working, he will want it sent to his mail order pharmacy.

## 2014-03-02 NOTE — Progress Notes (Signed)
Note faxed.

## 2014-03-03 ENCOUNTER — Other Ambulatory Visit: Payer: Self-pay | Admitting: *Deleted

## 2014-03-03 DIAGNOSIS — G1221 Amyotrophic lateral sclerosis: Secondary | ICD-10-CM

## 2014-03-04 ENCOUNTER — Telehealth: Payer: Self-pay | Admitting: *Deleted

## 2014-03-04 NOTE — Telephone Encounter (Signed)
The social worker from [facility] called to let us know that she will not be going to see patient this week per family's request.  She will see them next week.

## 2014-03-04 NOTE — Telephone Encounter (Signed)
Noted  

## 2014-03-04 NOTE — Telephone Encounter (Signed)
FYI: PT order from [facility]  for 4 times week for three week

## 2014-03-05 ENCOUNTER — Telehealth: Payer: Self-pay | Admitting: *Deleted

## 2014-03-05 NOTE — Telephone Encounter (Signed)
Patient called stating that his legs have gotten extremely weak since he started his new medication 3 days ago.  (Riluzole)  I spoke with Dr. Posey Pronto and she said that he could stop med but that was not the cause of the weakness.  Called patient back and informed him that it was not the med but the progression of the disease.  He has chosen to stay on the med since it is not the cause.

## 2014-03-22 ENCOUNTER — Telehealth: Payer: Self-pay | Admitting: *Deleted

## 2014-03-22 NOTE — Telephone Encounter (Signed)
Patient called stating that he is having to urinate about every hour.  Could this be from the Riluzole?  He and his wife are also not very happy with [facility].  They never know when the NA is going to show up and they would like to be able to make other plans instead of having to wait for the nurse.  Informed him that I will call the director and see if we can't make any changes.  If not, he would like to change companies.

## 2014-03-23 ENCOUNTER — Other Ambulatory Visit: Payer: Self-pay | Admitting: *Deleted

## 2014-03-23 DIAGNOSIS — G1221 Amyotrophic lateral sclerosis: Secondary | ICD-10-CM

## 2014-03-23 NOTE — Telephone Encounter (Signed)
Informed patient about what Dr. Posey Pronto had said.  He will contact his PCP.  Also, called Debbie [employer] forces logistics/support/administrative officer) at [facility] and she said that she would speak to the staff members that work with Mr. Nawrot and they will be calling him the night before to schedule the time for the NA to be there.

## 2014-03-23 NOTE — Telephone Encounter (Signed)
Urinary symptoms are unlikely related to riluzole.  In rare cases, riluzole can cause urinary retention, not frequent urination.  Does he have burning urination or pain?  Urinary tract infection can make you urinary frequently.  He may want to discuss this with his PCP.  Regarding home health, where you able to discuss with the director?  If they would like an alterative home health, please send referral.  Nakeesha Bowler K. Posey Pronto, DO

## 2014-04-01 ENCOUNTER — Telehealth: Payer: Self-pay | Admitting: Neurology

## 2014-04-01 NOTE — Telephone Encounter (Signed)
Labs dated 04/01/2013 received: ODM 136, potassium 4.6, chloride 98, glucose 129, creatinine 1.3*, calcium 9.5, AST 35, ALP 31, alkaline phosphatase 44  Recheck in 1 month.

## 2014-04-07 ENCOUNTER — Encounter: Payer: Self-pay | Admitting: Neurology

## 2014-04-13 ENCOUNTER — Encounter: Payer: Self-pay | Admitting: Neurology

## 2014-04-14 ENCOUNTER — Telehealth: Payer: Self-pay | Admitting: Neurology

## 2014-04-14 NOTE — Telephone Encounter (Signed)
Pt called requesting to speak to a nurse regarding physical therapy and other services from [facility]. Please call pt C/B [phone]

## 2014-04-14 NOTE — Telephone Encounter (Signed)
Patient called stating that [facility] would no longer be working with him after next week.  Informed him that I have given them a verbal order to continue care.

## 2014-04-21 ENCOUNTER — Other Ambulatory Visit: Payer: Self-pay | Admitting: *Deleted

## 2014-04-21 ENCOUNTER — Telehealth: Payer: Self-pay | Admitting: Neurology

## 2014-04-21 DIAGNOSIS — Z79899 Other long term (current) drug therapy: Secondary | ICD-10-CM

## 2014-04-21 NOTE — Telephone Encounter (Signed)
I called [facility] and spoke with Jackelyn Poling and she said that they will extend his PT.  Patient notified.

## 2014-04-21 NOTE — Telephone Encounter (Signed)
Pt called requesting to speak with Caryl Pina regarding [facility] C/B [phone]

## 2014-04-29 ENCOUNTER — Telehealth: Payer: Self-pay | Admitting: *Deleted

## 2014-04-29 NOTE — Telephone Encounter (Signed)
Calling to state they will be d/c patient  from PT in 2 weeks  June from [facility] called

## 2014-05-03 ENCOUNTER — Telehealth: Payer: Self-pay | Admitting: *Deleted

## 2014-05-03 ENCOUNTER — Other Ambulatory Visit: Payer: Self-pay | Admitting: *Deleted

## 2014-05-03 MED ORDER — RILUZOLE 50 MG PO TABS: ORAL_TABLET | ORAL | Status: DC

## 2014-05-03 NOTE — Telephone Encounter (Signed)
Patient has a new insurance card and needs new Rx for his ALS medication mailed to him.  Will send this out today.

## 2014-05-03 NOTE — Telephone Encounter (Signed)
Please call patient about RX Call back number [phone]

## 2014-05-12 ENCOUNTER — Telehealth: Payer: Self-pay | Admitting: Neurology

## 2014-05-12 NOTE — Telephone Encounter (Signed)
June physical therapist from [facility] called regarding pt's physical therapy. June wanted to let Dr. Posey Pronto know that pt will not start his therapy till next week due to the holidays. C/b [phone]

## 2014-05-12 NOTE — Telephone Encounter (Signed)
Noted  

## 2014-05-24 ENCOUNTER — Other Ambulatory Visit: Payer: Self-pay | Admitting: *Deleted

## 2014-05-24 ENCOUNTER — Telehealth: Payer: Self-pay | Admitting: Neurology

## 2014-05-24 DIAGNOSIS — Z79899 Other long term (current) drug therapy: Secondary | ICD-10-CM

## 2014-05-24 NOTE — Telephone Encounter (Signed)
Pt states that he needs to talk to you about some things please call [phone]

## 2014-05-24 NOTE — Telephone Encounter (Signed)
Patient changed insurance to [employer].  He would like to change home health companies.  I informed him that I would check around with some other places and send a referral.  Patient agreed.

## 2014-05-31 ENCOUNTER — Other Ambulatory Visit: Payer: Self-pay | Admitting: Neurology

## 2014-05-31 LAB — COMPREHENSIVE METABOLIC PANEL
ALBUMIN: 3.8 g/dL (ref 3.5–5.2)
ALT: 17 U/L (ref 0–53)
AST: 25 U/L (ref 0–37)
Alkaline Phosphatase: 45 U/L (ref 39–117)
BILIRUBIN TOTAL: 0.5 mg/dL (ref 0.2–1.2)
BUN: 20 mg/dL (ref 6–23)
CHLORIDE: 102 meq/L (ref 96–112)
CO2: 29 mEq/L (ref 19–32)
Calcium: 9.2 mg/dL (ref 8.4–10.5)
Creat: 1.19 mg/dL (ref 0.50–1.35)
GLUCOSE: 110 mg/dL — AB (ref 70–99)
POTASSIUM: 4.1 meq/L (ref 3.5–5.3)
Sodium: 137 mEq/L (ref 135–145)
Total Protein: 6.4 g/dL (ref 6.0–8.3)

## 2014-05-31 LAB — CBC
HEMATOCRIT: 36.4 % — AB (ref 39.0–52.0)
Hemoglobin: 12.2 g/dL — ABNORMAL LOW (ref 13.0–17.0)
MCH: 29.1 pg (ref 26.0–34.0)
MCHC: 33.5 g/dL (ref 30.0–36.0)
MCV: 86.9 fL (ref 78.0–100.0)
MPV: 8.9 fL (ref 8.6–12.4)
PLATELETS: 138 10*3/uL — AB (ref 150–400)
RBC: 4.19 MIL/uL — ABNORMAL LOW (ref 4.22–5.81)
RDW: 14.4 % (ref 11.5–15.5)
WBC: 4.2 10*3/uL (ref 4.0–10.5)

## 2014-06-28 ENCOUNTER — Ambulatory Visit: Payer: Commercial Managed Care - HMO | Admitting: Neurology

## 2014-09-07 ENCOUNTER — Ambulatory Visit: Payer: Commercial Managed Care - HMO | Admitting: Neurology

## 2015-01-30 ENCOUNTER — Emergency Department (HOSPITAL_COMMUNITY): Payer: PPO

## 2015-01-30 ENCOUNTER — Encounter (HOSPITAL_COMMUNITY): Payer: Self-pay

## 2015-01-30 ENCOUNTER — Emergency Department (HOSPITAL_COMMUNITY)
Admission: EM | Admit: 2015-01-30 | Discharge: 2015-01-30 | Disposition: A | Discharge status: Home / Self Care | Payer: PPO | Attending: Emergency Medicine | Admitting: Emergency Medicine

## 2015-01-30 DIAGNOSIS — I739 Peripheral vascular disease, unspecified: Secondary | ICD-10-CM | POA: Diagnosis not present

## 2015-01-30 DIAGNOSIS — M8580 Other specified disorders of bone density and structure, unspecified site: Secondary | ICD-10-CM | POA: Insufficient documentation

## 2015-01-30 DIAGNOSIS — Z7982 Long term (current) use of aspirin: Secondary | ICD-10-CM | POA: Diagnosis not present

## 2015-01-30 DIAGNOSIS — I10 Essential (primary) hypertension: Secondary | ICD-10-CM | POA: Insufficient documentation

## 2015-01-30 DIAGNOSIS — Z792 Long term (current) use of antibiotics: Secondary | ICD-10-CM | POA: Diagnosis not present

## 2015-01-30 DIAGNOSIS — R109 Unspecified abdominal pain: Secondary | ICD-10-CM | POA: Diagnosis present

## 2015-01-30 DIAGNOSIS — Z872 Personal history of diseases of the skin and subcutaneous tissue: Secondary | ICD-10-CM | POA: Diagnosis not present

## 2015-01-30 DIAGNOSIS — G629 Polyneuropathy, unspecified: Secondary | ICD-10-CM | POA: Diagnosis not present

## 2015-01-30 DIAGNOSIS — Z8639 Personal history of other endocrine, nutritional and metabolic disease: Secondary | ICD-10-CM | POA: Insufficient documentation

## 2015-01-30 DIAGNOSIS — N2889 Other specified disorders of kidney and ureter: Secondary | ICD-10-CM

## 2015-01-30 DIAGNOSIS — I2510 Atherosclerotic heart disease of native coronary artery without angina pectoris: Secondary | ICD-10-CM | POA: Diagnosis not present

## 2015-01-30 DIAGNOSIS — Z85118 Personal history of other malignant neoplasm of bronchus and lung: Secondary | ICD-10-CM | POA: Diagnosis not present

## 2015-01-30 DIAGNOSIS — R1084 Generalized abdominal pain: Secondary | ICD-10-CM | POA: Diagnosis not present

## 2015-01-30 DIAGNOSIS — Z79899 Other long term (current) drug therapy: Secondary | ICD-10-CM | POA: Insufficient documentation

## 2015-01-30 DIAGNOSIS — Z7902 Long term (current) use of antithrombotics/antiplatelets: Secondary | ICD-10-CM | POA: Insufficient documentation

## 2015-01-30 LAB — COMPREHENSIVE METABOLIC PANEL
ALBUMIN: 3.8 g/dL (ref 3.5–5.0)
ALK PHOS: 52 U/L (ref 38–126)
ALT: 20 U/L (ref 17–63)
ANION GAP: 7 (ref 5–15)
AST: 23 U/L (ref 15–41)
BUN: 26 mg/dL — AB (ref 6–20)
CALCIUM: 9.7 mg/dL (ref 8.9–10.3)
CO2: 29 mmol/L (ref 22–32)
Chloride: 102 mmol/L (ref 101–111)
Creatinine, Ser: 1.35 mg/dL — ABNORMAL HIGH (ref 0.61–1.24)
GFR calc Af Amer: 56 mL/min — ABNORMAL LOW (ref >60)
GFR calc non Af Amer: 48 mL/min — ABNORMAL LOW (ref >60)
GLUCOSE: 115 mg/dL — AB (ref 65–99)
POTASSIUM: 4.2 mmol/L (ref 3.5–5.1)
SODIUM: 138 mmol/L (ref 135–145)
Total Bilirubin: 0.6 mg/dL (ref 0.3–1.2)
Total Protein: 7.1 g/dL (ref 6.5–8.1)

## 2015-01-30 LAB — URINALYSIS, ROUTINE W REFLEX MICROSCOPIC
BILIRUBIN URINE: NEGATIVE
Glucose, UA: NEGATIVE mg/dL
HGB URINE DIPSTICK: NEGATIVE
Ketones, ur: NEGATIVE mg/dL
Leukocytes, UA: NEGATIVE
Nitrite: NEGATIVE
PROTEIN: NEGATIVE mg/dL
Specific Gravity, Urine: 1.017 (ref 1.005–1.030)
UROBILINOGEN UA: 0.2 mg/dL (ref 0.0–1.0)
pH: 5.5 (ref 5.0–8.0)

## 2015-01-30 LAB — CBC
HCT: 39.2 % (ref 39.0–52.0)
HEMOGLOBIN: 13.2 g/dL (ref 13.0–17.0)
MCH: 29.9 pg (ref 26.0–34.0)
MCHC: 33.7 g/dL (ref 30.0–36.0)
MCV: 88.9 fL (ref 78.0–100.0)
Platelets: 148 10*3/uL — ABNORMAL LOW (ref 150–400)
RBC: 4.41 MIL/uL (ref 4.22–5.81)
RDW: 13.2 % (ref 11.5–15.5)
WBC: 6.1 10*3/uL (ref 4.0–10.5)

## 2015-01-30 LAB — LIPASE, BLOOD: Lipase: 24 U/L (ref 22–51)

## 2015-01-30 MED ORDER — IOHEXOL 300 MG/ML  SOLN
100.0000 mL | Freq: Once | INTRAMUSCULAR | Status: AC | PRN
  Administered 2015-01-30: 100 mL via INTRAVENOUS

## 2015-01-30 MED ORDER — SODIUM CHLORIDE 0.9 % IV BOLUS (SEPSIS): 500.0000 mL | Freq: Once | INTRAVENOUS | Status: DC

## 2015-01-30 MED ORDER — IOHEXOL 300 MG/ML  SOLN
25.0000 mL | Freq: Once | INTRAMUSCULAR | Status: DC | PRN
  Administered 2015-01-30: 25 mL via ORAL
  Filled 2015-01-30: qty 30

## 2015-01-30 NOTE — ED Notes (Signed)
Pt given a urinal and is aware we need a urine sample.

## 2015-01-30 NOTE — ED Notes (Signed)
Patient was alert, oriented and stable upon discharge. RN went over AVS and patient had no further questions.  

## 2015-01-30 NOTE — ED Notes (Signed)
Pt c/o abdominal distention x 2 days.  Pain score 2/10. Denies n/v/d.  Pt's family reports "it seemed to get better during the day yesterday, but was back this morning.  It gets worse when her lays down."  Recent UTI diagnosis and started on Cipro.

## 2015-01-30 NOTE — ED Provider Notes (Signed)
CSN: [id number]     Arrival date & time 01/30/15  5638 History   First MD Initiated Contact with Patient 01/30/15 1236     Chief Complaint  Patient presents with  . Abdominal Pain     (Consider location/radiation/quality/duration/timing/severity/associated sxs/prior Treatment) HPI 81 year old male with history of CABG and no prior abdominal surgeries who presents with abdominal pain. Reports Thursday night, developed abdominal pain and back pain. Pain not associated with eating. Typically starting 5-6 hours after dinner, when he is in bed and worse whe he is laying down. Pain generalized over abdomen, reports that it feels hard. Pain not relieved with Tylenol, last for hours before dissipating. Denies distension, urinary complaints, diarrhea, melena, hematochezia, fever, nausea, vomiting, chills. Reports that he takes miralax and has a bowel movement every other day, which is normal for him. Currently undergoing treatment for UTI with ciprofloxacin.  Past Medical History  Diagnosis Date  . Peroneal nerve palsy     left after fall off ladder  . Hypertension   . Foot drop, left     chronic  . Lumbar spondylosis   . CAD (coronary artery disease)   . Cancer     lung  . Peripheral vascular disease   . Hearing deficit     right hearing aid  . Abnormality of gait 01/28/2013  . Polyneuropathy in other diseases classified elsewhere 01/28/2013  . Rosacea   . Dyslipidemia   . Osteopenia   . Lumbosacral radiculopathy 03/27/2013  . ALS (amyotrophic lateral sclerosis) 01/26/2014   Past Surgical History  Procedure Laterality Date  . Back surgery  2008,2010, 2013, 2014    lumbosacral  . Spinal cord stimulator implant      and removal in 04/2012  . Lung lobectomy  1997  . Cardiac catheterization    . Coronary stent placement      X4  . Coronary artery bypass graft     Family History  Problem Relation Age of Onset  . Cancer Mother   . Heart disease Mother   . Hyperlipidemia Mother   .  Heart attack Mother   . Heart disease Father     before age 81  . Heart attack Father   . Cancer Sister     breast  . Hyperlipidemia Sister   . Hypertension Sister   . Heart attack Sister   . Cancer Brother     lung  . Heart disease Brother   . Hyperlipidemia Son    Social History  Substance Use Topics  . Smoking status: Former Smoker -- 1.00 packs/day for 20 years    Types: Cigarettes    Quit date: 07/19/1980  . Smokeless tobacco: Never Used  . Alcohol Use: No    Review of Systems 10/14 systems reviewed and are negative other than those stated in the HPI   Allergies  Altace  Home Medications   Prior to Admission medications   Medication Sig Start Date End Date Taking? Authorizing Provider  alendronate (FOSAMAX) 70 MG tablet Take 70 mg by mouth once a week. Saturday 11/10/12  Yes Historical Provider, MD  aspirin 81 MG tablet Take 81 mg by mouth daily.   Yes Historical Provider, MD  Calcium-Vitamin D 600-200 MG-UNIT per tablet Take 1 tablet by mouth daily.    Yes Historical Provider, MD  carvedilol (COREG) 12.5 MG tablet Take 12.5 mg by mouth 2 (two) times daily with a meal.  11/03/11  Yes Historical Provider, MD  ciprofloxacin (CIPRO) 500 MG  tablet Take 500 mg by mouth 2 (two) times daily.   Yes Historical Provider, MD  clopidogrel (PLAVIX) 75 MG tablet Take 75 mg by mouth Daily. 11/03/11  Yes Historical Provider, MD  gabapentin (NEURONTIN) 300 MG capsule Take 300 mg by mouth daily. 12/13/12  Yes Historical Provider, MD  minocycline (DYNACIN) 75 MG tablet Take 75 mg by mouth daily. once daily. 10/14/13  Yes Historical Provider, MD  NITROSTAT 0.4 MG SL tablet Take 0.4 mg by mouth every 5 (five) minutes as needed for chest pain.  11/25/12  Yes Historical Provider, MD  polyethylene glycol (MIRALAX / GLYCOLAX) packet Take 17 g by mouth daily.     Historical Provider, MD   BP 134/73 mmHg  Pulse 66  Temp(Src) 98 F (36.7 C) (Oral)  Resp 20  SpO2 97% Physical Exam Physical Exam   Nursing note and vitals reviewed. Constitutional: elderly appearing male, well developed, well nourished, non-toxic, and in no acute distress Head: Normocephalic and atraumatic.  Mouth/Throat: Oropharynx is clear and moist.  Neck: Normal range of motion. Neck supple.  Cardiovascular: Normal rate and regular rhythm.   Pulmonary/Chest: Effort normal and breath sounds normal.  Abdominal: Soft. There is no tenderness. There is no rebound and no guarding.  Musculoskeletal: Normal range of motion.  Neurological: Alert, no facial droop, fluent speech, moves all extremities symmetrically Skin: Skin is warm and dry.  Psychiatric: Cooperative  ED Course  Procedures (including critical care time) Labs Review Labs Reviewed  COMPREHENSIVE METABOLIC PANEL - Abnormal; Notable for the following:    Glucose, Bld 115 (*)    BUN 26 (*)    Creatinine, Ser 1.35 (*)    GFR calc non Af Amer 48 (*)    GFR calc Af Amer 56 (*)    All other components within normal limits  CBC - Abnormal; Notable for the following:    Platelets 148 (*)    All other components within normal limits  URINALYSIS, ROUTINE W REFLEX MICROSCOPIC (NOT AT [facility]) - Abnormal; Notable for the following:    APPearance CLOUDY (*)    All other components within normal limits  LIPASE, BLOOD    Imaging Review Ct Abdomen Pelvis W Contrast  01/30/2015   CLINICAL DATA:  Generalized abdominal pain and distention for 2 days  EXAM: CT ABDOMEN AND PELVIS WITH CONTRAST  TECHNIQUE: Multidetector CT imaging of the abdomen and pelvis was performed using the standard protocol following bolus administration of intravenous contrast.  CONTRAST:  50mL OMNIPAQUE IOHEXOL 300 MG/ML SOLN, 163mL OMNIPAQUE IOHEXOL 300 MG/ML SOLN  COMPARISON:  Abdominal MRI 09/09/2014  FINDINGS: Lower chest: Minimal subpleural atelectasis or scarring noted. Mild respiratory motion artifact at the lung bases. Median sternotomy wires are partly visualized.  Hepatobiliary: Liver and  gallbladder appear unremarkable.  Pancreas: Normal  Spleen: Normal  Adrenals/Urinary Tract: Adrenal glands are normal. Too small to characterize left renal cortical hypodense lesions measuring 5 mm and smaller are reidentified, compatible with small cysts. There has been interval increase in size of a cystic/solid enhancing mass in the right lower renal pole measuring 2.8 cm maximally image 34. No hydronephrosis on either side. No ureteral calculus.  Stomach/Bowel: Normal appendix. No bowel wall thickening or focal segmental dilatation is identified.  Vascular/Lymphatic: No lymphadenopathy. Moderate atheromatous aortic calcification without aneurysm.  Other: No free air or fluid.  Musculoskeletal: Evidence of L5-S1 left microdiscectomy/decompression. Fusion hardware reidentified spanning L2 through L4. Multiple endplate compression deformities are reidentified at that level. No acute osseous abnormality.  IMPRESSION: No acute intra-abdominal or pelvic pathology.  Apparent increase in size of enhancing cystic/ solid right lower renal pole cortical mass, highly suspicious for renal cell carcinoma. Consultation with urology is recommended.  These results were called by telephone at the time of interpretation on 01/30/2015 at 3:09 pm to Dr. Brantley Stage , who verbally acknowledged these results.   Electronically Signed   By: Conchita Paris M.D.   On: 01/30/2015 15:23   I have personally reviewed and evaluated these images and lab results as part of my medical decision-making.    MDM   Final diagnoses:  Generalized abdominal pain  Renal mass    In short, this is an 81 year old male with history of CAD status post CABG, hypertension, and hyperlipidemia who presents to emergency department with generalized abdominal pain. At the patient has been pain-free during his ED visit. His vital signs are non-concerning. His abdomen is soft, nontender, and benign. Remainder of exam is unremarkable. Pain does not truly seem  post-prandial in nature and not consistent with that of mesenteric ischemia in the setting of his history of vasculopathy. Blood work including CBC, CmP, lipase,UA are unremarkable. Currently asymptomatic, but CT abd/pelvis performed for infectious, obstruction, or other acute surgical cause. Visualized and reviewed with radiology. Unremarkable aside from enlarging renal mass. Discussed with patient and family, and they have received follow-up with urology from [place] for this. Will continue to follow-up with them. Patient tolerates PO and remains pain free. Felt appropriate for discharge home. Strict return and follow-up instructions reviewed. He expressed understanding of all discharge instructions and felt comfortable with the plan of care.   Forde Dandy, MD 01/30/15 1921

## 2015-01-30 NOTE — Discharge Instructions (Signed)
The kidney mass that was seen on your MRI in April has gotten larger on today's CT scan. Please follow-up with your urologist regarding any further management of this. Return without fail for worsening symptoms, including vomiting and unable to keep down food or fluids, worsening pain, fevers, confusion, or any other symptoms concerning to you.   Abdominal Pain Many things can cause abdominal pain. Usually, abdominal pain is not caused by a disease and will improve without treatment. It can often be observed and treated at home. Your health care provider will do a physical exam and possibly order blood tests and X-rays to help determine the seriousness of your pain. However, in many cases, more time must pass before a clear cause of the pain can be found. Before that point, your health care provider may not know if you need more testing or further treatment. HOME CARE INSTRUCTIONS  Monitor your abdominal pain for any changes. The following actions may help to alleviate any discomfort you are experiencing:  Only take over-the-counter or prescription medicines as directed by your health care provider.  Do not take laxatives unless directed to do so by your health care provider.  Try a clear liquid diet (broth, tea, or water) as directed by your health care provider. Slowly move to a bland diet as tolerated. SEEK MEDICAL CARE IF:  You have unexplained abdominal pain.  You have abdominal pain associated with nausea or diarrhea.  You have pain when you urinate or have a bowel movement.  You experience abdominal pain that wakes you in the night.  You have abdominal pain that is worsened or improved by eating food.  You have abdominal pain that is worsened with eating fatty foods.  You have a fever. SEEK IMMEDIATE MEDICAL CARE IF:   Your pain does not go away within 2 hours.  You keep throwing up (vomiting).  Your pain is felt only in portions of the abdomen, such as the right side or the  left lower portion of the abdomen.  You pass bloody or black tarry stools. MAKE SURE YOU:  Understand these instructions.   Will watch your condition.   Will get help right away if you are not doing well or get worse.  Document Released: 02/14/2005 Document Revised: 05/12/2013 Document Reviewed: 01/14/2013 [facility] Patient Information 2015 [place], [place]. This information is not intended to replace advice given to you by your health care provider. Make sure you discuss any questions you have with your health care provider.

## 2015-05-24 DIAGNOSIS — N2889 Other specified disorders of kidney and ureter: Secondary | ICD-10-CM | POA: Diagnosis not present

## 2015-05-24 DIAGNOSIS — C641 Malignant neoplasm of right kidney, except renal pelvis: Secondary | ICD-10-CM | POA: Diagnosis not present

## 2015-05-30 ENCOUNTER — Telehealth: Payer: Self-pay | Admitting: Neurology

## 2015-05-30 NOTE — Telephone Encounter (Signed)
Caryl Pina, can you find out whether this patient is taking Riluzole for ALS?  It's been over a year since we have seen him and if he is taking the medication, he needs to have CMP checked.  Thanks.  Yasha Tibbett K. Posey Pronto, DO

## 2015-05-31 DIAGNOSIS — Z111 Encounter for screening for respiratory tuberculosis: Secondary | ICD-10-CM | POA: Diagnosis not present

## 2015-05-31 DIAGNOSIS — I2510 Atherosclerotic heart disease of native coronary artery without angina pectoris: Secondary | ICD-10-CM | POA: Diagnosis not present

## 2015-05-31 DIAGNOSIS — G259 Extrapyramidal and movement disorder, unspecified: Secondary | ICD-10-CM | POA: Diagnosis not present

## 2015-05-31 DIAGNOSIS — G1221 Amyotrophic lateral sclerosis: Secondary | ICD-10-CM | POA: Diagnosis not present

## 2015-05-31 DIAGNOSIS — I10 Essential (primary) hypertension: Secondary | ICD-10-CM | POA: Diagnosis not present

## 2015-05-31 DIAGNOSIS — N183 Chronic kidney disease, stage 3 (moderate): Secondary | ICD-10-CM | POA: Diagnosis not present

## 2015-05-31 DIAGNOSIS — I480 Paroxysmal atrial fibrillation: Secondary | ICD-10-CM | POA: Diagnosis not present

## 2015-05-31 DIAGNOSIS — Z66 Do not resuscitate: Secondary | ICD-10-CM | POA: Diagnosis not present

## 2015-05-31 DIAGNOSIS — L719 Rosacea, unspecified: Secondary | ICD-10-CM | POA: Diagnosis not present

## 2015-05-31 DIAGNOSIS — M810 Age-related osteoporosis without current pathological fracture: Secondary | ICD-10-CM | POA: Diagnosis not present

## 2015-05-31 DIAGNOSIS — N2889 Other specified disorders of kidney and ureter: Secondary | ICD-10-CM | POA: Diagnosis not present

## 2015-06-01 NOTE — Telephone Encounter (Signed)
Noted  

## 2015-06-01 NOTE — Telephone Encounter (Signed)
I spoke with patient's daughter and he is not taking Riluzole.

## 2015-06-02 DIAGNOSIS — L570 Actinic keratosis: Secondary | ICD-10-CM | POA: Diagnosis not present

## 2015-06-02 DIAGNOSIS — L578 Other skin changes due to chronic exposure to nonionizing radiation: Secondary | ICD-10-CM | POA: Diagnosis not present

## 2015-06-02 DIAGNOSIS — L300 Nummular dermatitis: Secondary | ICD-10-CM | POA: Diagnosis not present

## 2015-06-08 DIAGNOSIS — I480 Paroxysmal atrial fibrillation: Secondary | ICD-10-CM | POA: Diagnosis not present

## 2015-06-08 DIAGNOSIS — G1221 Amyotrophic lateral sclerosis: Secondary | ICD-10-CM | POA: Diagnosis not present

## 2015-06-08 DIAGNOSIS — N189 Chronic kidney disease, unspecified: Secondary | ICD-10-CM | POA: Diagnosis not present

## 2015-06-08 DIAGNOSIS — I2510 Atherosclerotic heart disease of native coronary artery without angina pectoris: Secondary | ICD-10-CM | POA: Diagnosis not present

## 2015-06-08 DIAGNOSIS — Z9181 History of falling: Secondary | ICD-10-CM | POA: Diagnosis not present

## 2015-06-17 DIAGNOSIS — N189 Chronic kidney disease, unspecified: Secondary | ICD-10-CM | POA: Diagnosis not present

## 2015-06-17 DIAGNOSIS — I2510 Atherosclerotic heart disease of native coronary artery without angina pectoris: Secondary | ICD-10-CM | POA: Diagnosis not present

## 2015-06-17 DIAGNOSIS — I480 Paroxysmal atrial fibrillation: Secondary | ICD-10-CM | POA: Diagnosis not present

## 2015-06-17 DIAGNOSIS — G1221 Amyotrophic lateral sclerosis: Secondary | ICD-10-CM | POA: Diagnosis not present

## 2015-06-29 DIAGNOSIS — N189 Chronic kidney disease, unspecified: Secondary | ICD-10-CM | POA: Diagnosis not present

## 2015-06-29 DIAGNOSIS — I480 Paroxysmal atrial fibrillation: Secondary | ICD-10-CM | POA: Diagnosis not present

## 2015-06-29 DIAGNOSIS — Z9181 History of falling: Secondary | ICD-10-CM | POA: Diagnosis not present

## 2015-06-29 DIAGNOSIS — G1221 Amyotrophic lateral sclerosis: Secondary | ICD-10-CM | POA: Diagnosis not present

## 2015-06-30 DIAGNOSIS — I2510 Atherosclerotic heart disease of native coronary artery without angina pectoris: Secondary | ICD-10-CM | POA: Diagnosis not present

## 2015-06-30 DIAGNOSIS — N183 Chronic kidney disease, stage 3 (moderate): Secondary | ICD-10-CM | POA: Diagnosis not present

## 2015-06-30 DIAGNOSIS — G1221 Amyotrophic lateral sclerosis: Secondary | ICD-10-CM | POA: Diagnosis not present

## 2015-06-30 DIAGNOSIS — N2889 Other specified disorders of kidney and ureter: Secondary | ICD-10-CM | POA: Diagnosis not present

## 2015-06-30 DIAGNOSIS — R600 Localized edema: Secondary | ICD-10-CM | POA: Diagnosis not present

## 2015-06-30 DIAGNOSIS — I480 Paroxysmal atrial fibrillation: Secondary | ICD-10-CM | POA: Diagnosis not present

## 2015-06-30 DIAGNOSIS — I129 Hypertensive chronic kidney disease with stage 1 through stage 4 chronic kidney disease, or unspecified chronic kidney disease: Secondary | ICD-10-CM | POA: Diagnosis not present

## 2015-06-30 DIAGNOSIS — M5136 Other intervertebral disc degeneration, lumbar region: Secondary | ICD-10-CM | POA: Diagnosis not present

## 2015-06-30 DIAGNOSIS — I6523 Occlusion and stenosis of bilateral carotid arteries: Secondary | ICD-10-CM | POA: Diagnosis not present

## 2015-06-30 DIAGNOSIS — J40 Bronchitis, not specified as acute or chronic: Secondary | ICD-10-CM | POA: Diagnosis not present

## 2015-07-12 DIAGNOSIS — G1221 Amyotrophic lateral sclerosis: Secondary | ICD-10-CM | POA: Diagnosis not present

## 2015-07-12 DIAGNOSIS — R262 Difficulty in walking, not elsewhere classified: Secondary | ICD-10-CM | POA: Diagnosis not present

## 2015-07-12 DIAGNOSIS — G6289 Other specified polyneuropathies: Secondary | ICD-10-CM | POA: Diagnosis not present

## 2015-07-12 DIAGNOSIS — M6281 Muscle weakness (generalized): Secondary | ICD-10-CM | POA: Diagnosis not present

## 2015-07-20 DIAGNOSIS — L570 Actinic keratosis: Secondary | ICD-10-CM | POA: Diagnosis not present

## 2015-07-20 DIAGNOSIS — L300 Nummular dermatitis: Secondary | ICD-10-CM | POA: Diagnosis not present

## 2015-07-29 DIAGNOSIS — G1221 Amyotrophic lateral sclerosis: Secondary | ICD-10-CM | POA: Diagnosis not present

## 2015-07-29 DIAGNOSIS — I480 Paroxysmal atrial fibrillation: Secondary | ICD-10-CM | POA: Diagnosis not present

## 2015-07-29 DIAGNOSIS — G64 Other disorders of peripheral nervous system: Secondary | ICD-10-CM | POA: Diagnosis not present

## 2015-07-29 DIAGNOSIS — I10 Essential (primary) hypertension: Secondary | ICD-10-CM | POA: Diagnosis not present

## 2015-08-03 DIAGNOSIS — I10 Essential (primary) hypertension: Secondary | ICD-10-CM | POA: Diagnosis not present

## 2015-08-03 DIAGNOSIS — I480 Paroxysmal atrial fibrillation: Secondary | ICD-10-CM | POA: Diagnosis not present

## 2015-08-03 DIAGNOSIS — I129 Hypertensive chronic kidney disease with stage 1 through stage 4 chronic kidney disease, or unspecified chronic kidney disease: Secondary | ICD-10-CM | POA: Diagnosis not present

## 2015-08-03 DIAGNOSIS — N183 Chronic kidney disease, stage 3 (moderate): Secondary | ICD-10-CM | POA: Diagnosis not present

## 2015-08-03 DIAGNOSIS — I6523 Occlusion and stenosis of bilateral carotid arteries: Secondary | ICD-10-CM | POA: Diagnosis not present

## 2015-08-03 DIAGNOSIS — G1221 Amyotrophic lateral sclerosis: Secondary | ICD-10-CM | POA: Diagnosis not present

## 2015-08-03 DIAGNOSIS — Z79899 Other long term (current) drug therapy: Secondary | ICD-10-CM | POA: Diagnosis not present

## 2015-08-03 DIAGNOSIS — K5904 Chronic idiopathic constipation: Secondary | ICD-10-CM | POA: Diagnosis not present

## 2015-08-08 DIAGNOSIS — I2510 Atherosclerotic heart disease of native coronary artery without angina pectoris: Secondary | ICD-10-CM | POA: Diagnosis not present

## 2015-08-08 DIAGNOSIS — I480 Paroxysmal atrial fibrillation: Secondary | ICD-10-CM | POA: Diagnosis not present

## 2015-08-08 DIAGNOSIS — N189 Chronic kidney disease, unspecified: Secondary | ICD-10-CM | POA: Diagnosis not present

## 2015-08-08 DIAGNOSIS — G1221 Amyotrophic lateral sclerosis: Secondary | ICD-10-CM | POA: Diagnosis not present

## 2015-08-08 DIAGNOSIS — Z9181 History of falling: Secondary | ICD-10-CM | POA: Diagnosis not present

## 2015-08-19 DIAGNOSIS — L03115 Cellulitis of right lower limb: Secondary | ICD-10-CM | POA: Diagnosis not present

## 2015-08-19 DIAGNOSIS — D649 Anemia, unspecified: Secondary | ICD-10-CM | POA: Diagnosis not present

## 2015-08-23 DIAGNOSIS — I129 Hypertensive chronic kidney disease with stage 1 through stage 4 chronic kidney disease, or unspecified chronic kidney disease: Secondary | ICD-10-CM | POA: Diagnosis not present

## 2015-08-23 DIAGNOSIS — G1221 Amyotrophic lateral sclerosis: Secondary | ICD-10-CM | POA: Diagnosis not present

## 2015-08-23 DIAGNOSIS — M79674 Pain in right toe(s): Secondary | ICD-10-CM | POA: Diagnosis not present

## 2015-08-23 DIAGNOSIS — N183 Chronic kidney disease, stage 3 (moderate): Secondary | ICD-10-CM | POA: Diagnosis not present

## 2015-08-23 DIAGNOSIS — I480 Paroxysmal atrial fibrillation: Secondary | ICD-10-CM | POA: Diagnosis not present

## 2015-08-31 DIAGNOSIS — B351 Tinea unguium: Secondary | ICD-10-CM | POA: Diagnosis not present

## 2015-08-31 DIAGNOSIS — M79671 Pain in right foot: Secondary | ICD-10-CM | POA: Diagnosis not present

## 2015-08-31 DIAGNOSIS — M79672 Pain in left foot: Secondary | ICD-10-CM | POA: Diagnosis not present

## 2015-09-06 DIAGNOSIS — R471 Dysarthria and anarthria: Secondary | ICD-10-CM | POA: Diagnosis not present

## 2015-09-06 DIAGNOSIS — M6281 Muscle weakness (generalized): Secondary | ICD-10-CM | POA: Diagnosis not present

## 2015-09-06 DIAGNOSIS — G1221 Amyotrophic lateral sclerosis: Secondary | ICD-10-CM | POA: Diagnosis not present

## 2015-09-06 DIAGNOSIS — R262 Difficulty in walking, not elsewhere classified: Secondary | ICD-10-CM | POA: Diagnosis not present

## 2015-09-06 DIAGNOSIS — G6289 Other specified polyneuropathies: Secondary | ICD-10-CM | POA: Diagnosis not present

## 2015-09-26 DIAGNOSIS — W1830XA Fall on same level, unspecified, initial encounter: Secondary | ICD-10-CM | POA: Diagnosis not present

## 2015-09-26 DIAGNOSIS — Y939 Activity, unspecified: Secondary | ICD-10-CM | POA: Diagnosis not present

## 2015-09-26 DIAGNOSIS — S0101XA Laceration without foreign body of scalp, initial encounter: Secondary | ICD-10-CM | POA: Diagnosis not present

## 2015-09-26 DIAGNOSIS — S0191XA Laceration without foreign body of unspecified part of head, initial encounter: Secondary | ICD-10-CM | POA: Diagnosis not present

## 2015-09-26 DIAGNOSIS — Y92129 Unspecified place in nursing home as the place of occurrence of the external cause: Secondary | ICD-10-CM | POA: Diagnosis not present

## 2015-09-26 DIAGNOSIS — S199XXA Unspecified injury of neck, initial encounter: Secondary | ICD-10-CM | POA: Diagnosis not present

## 2015-09-26 DIAGNOSIS — R220 Localized swelling, mass and lump, head: Secondary | ICD-10-CM | POA: Diagnosis not present

## 2015-09-26 DIAGNOSIS — S0990XA Unspecified injury of head, initial encounter: Secondary | ICD-10-CM | POA: Diagnosis not present

## 2015-09-26 DIAGNOSIS — R51 Headache: Secondary | ICD-10-CM | POA: Diagnosis not present

## 2015-10-02 IMAGING — CT CT ABD-PELV W/ CM
2 of 5 series · 16 of 46 positions shown, 18 images · IV contrast (OMNIPAQUE 300)
Comparison: Abdominal MRI 09/09/2014

CLINICAL DATA: Generalized abdominal pain and distention for 2 days

EXAM:
CT ABDOMEN AND PELVIS WITH CONTRAST
TECHNIQUE: Multidetector CT imaging of the abdomen and pelvis was performed
using the standard protocol following bolus administration of
intravenous contrast.
CONTRAST:  25mL OMNIPAQUE IOHEXOL 300 MG/ML SOLN, 100mL OMNIPAQUE
IOHEXOL 300 MG/ML SOLN

[Series 2: abd/pel with · axial · 0.74mm/px · z∈[+1210,+1635]mm · 13 of 95 slices shown, 15 images]
[im 5/95  soft-tissue]
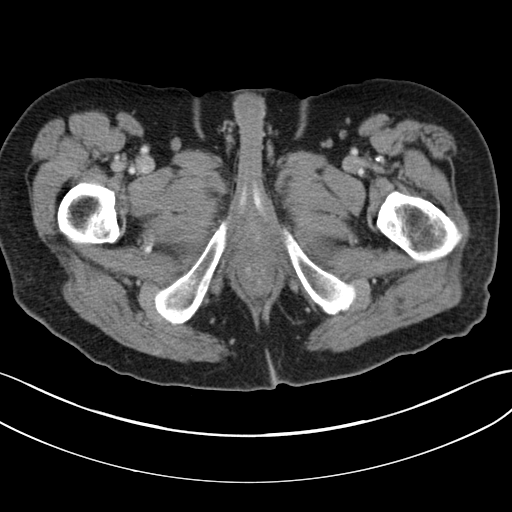
[im 5/95  bone]
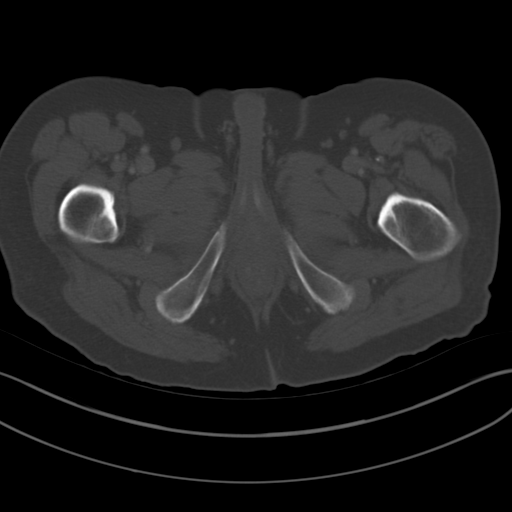
[im 15/95  soft-tissue]
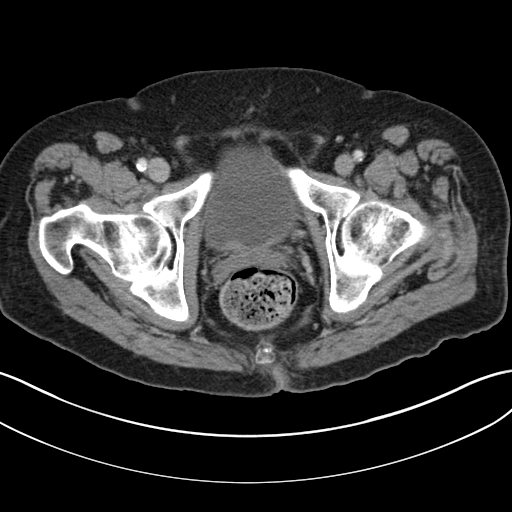
[im 19/95  soft-tissue]
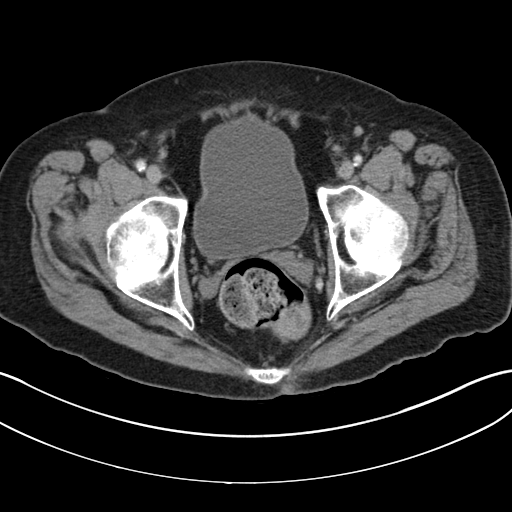
[im 29/95  soft-tissue]
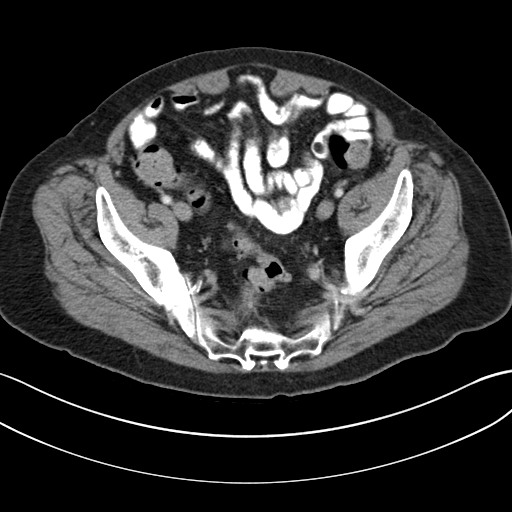
[im 33/95  soft-tissue]
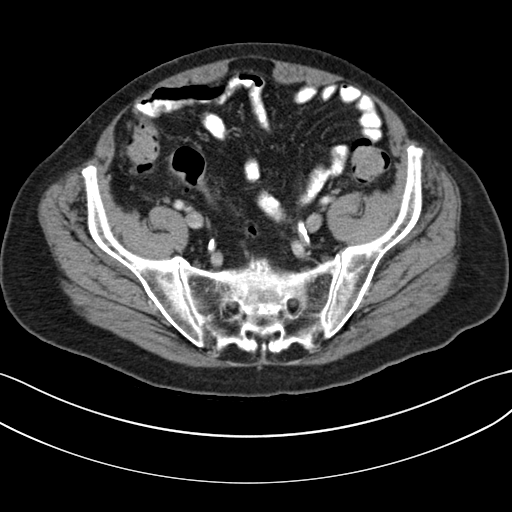
[im 43/95  soft-tissue]
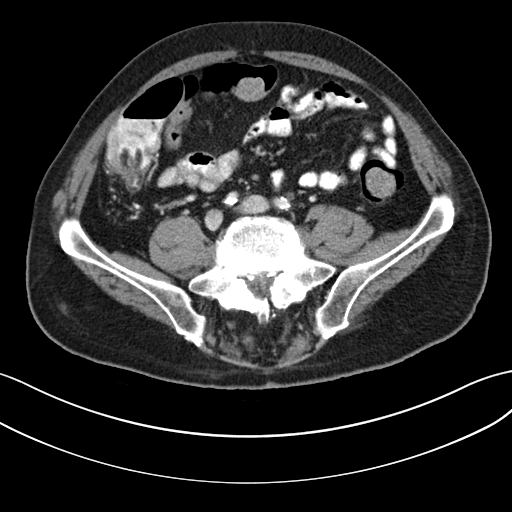
[im 48/95  soft-tissue]
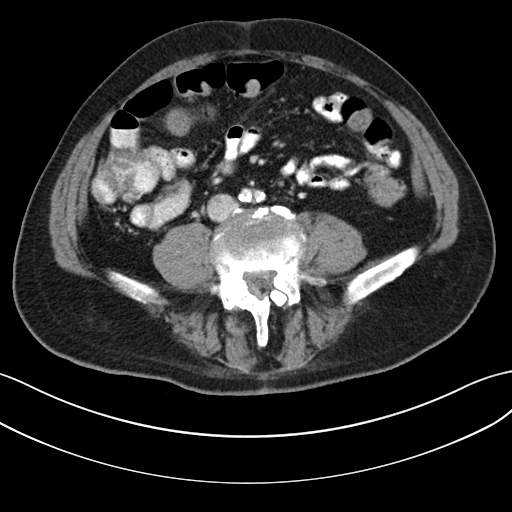
[im 52/95  soft-tissue]
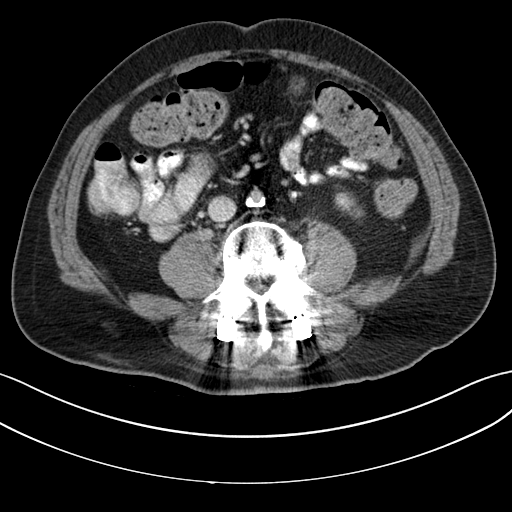
[im 62/95  soft-tissue]
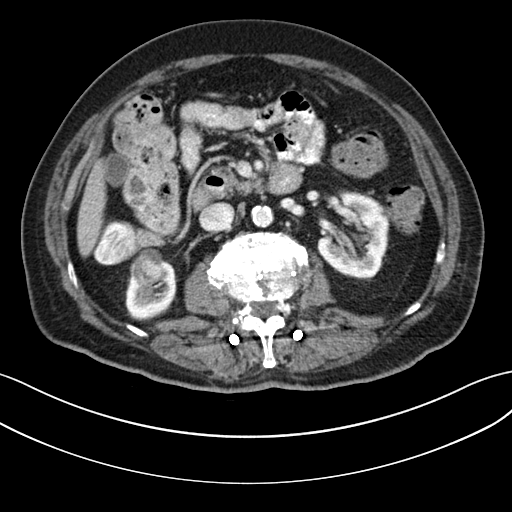
[im 62/95  bone]
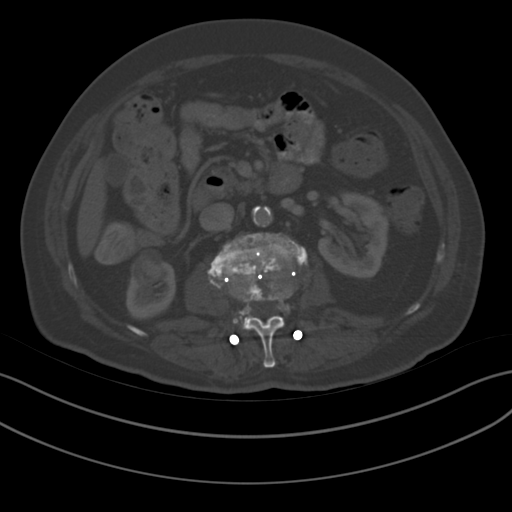
[im 66/95  soft-tissue]
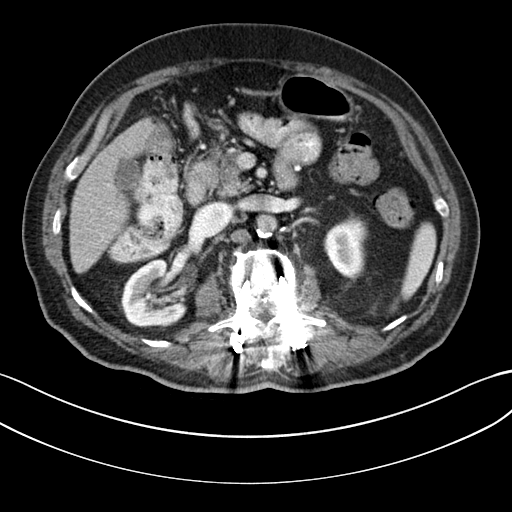
[im 76/95  soft-tissue]
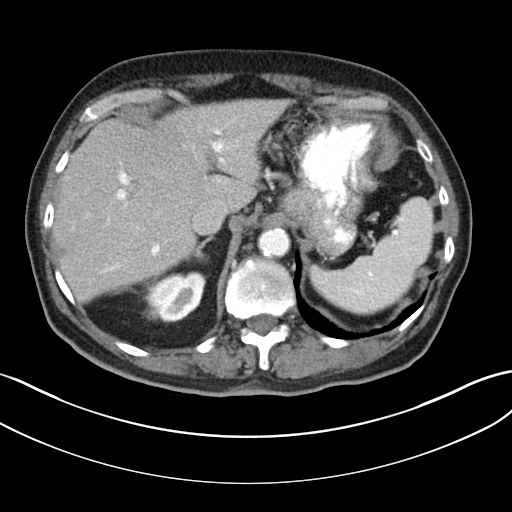
[im 80/95  soft-tissue]
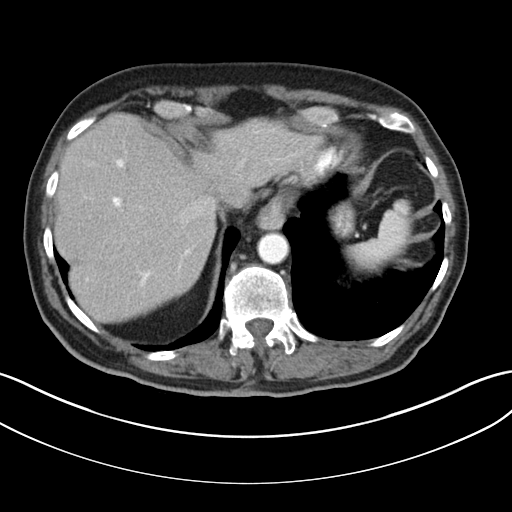
[im 90/95  soft-tissue]
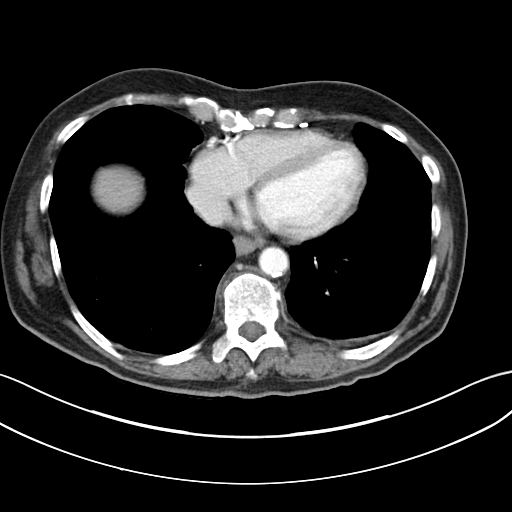

[Series 4: coronal a/|p · coronal · 0.74mm/px · 3 of 127 slices shown]
[im 43/127  soft-tissue]
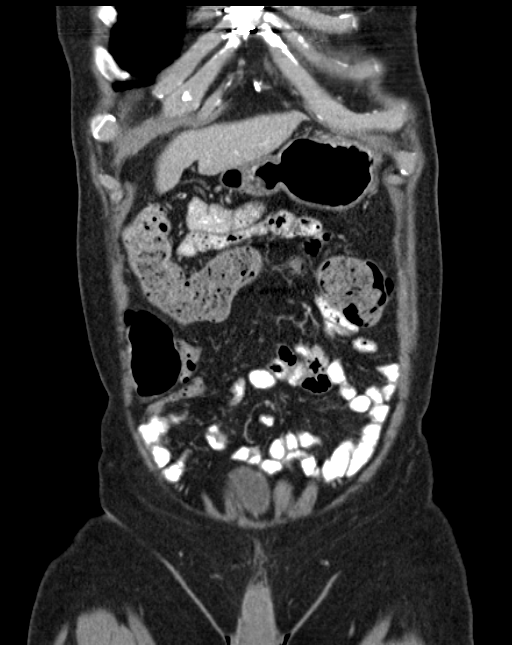
[im 57/127  soft-tissue]
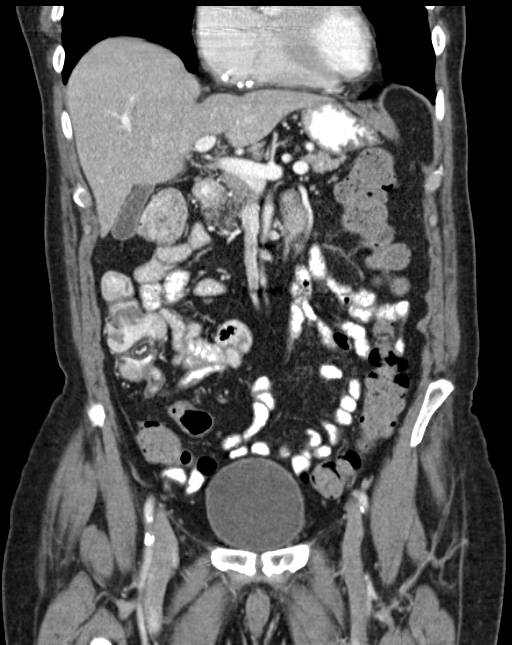
[im 71/127  soft-tissue]
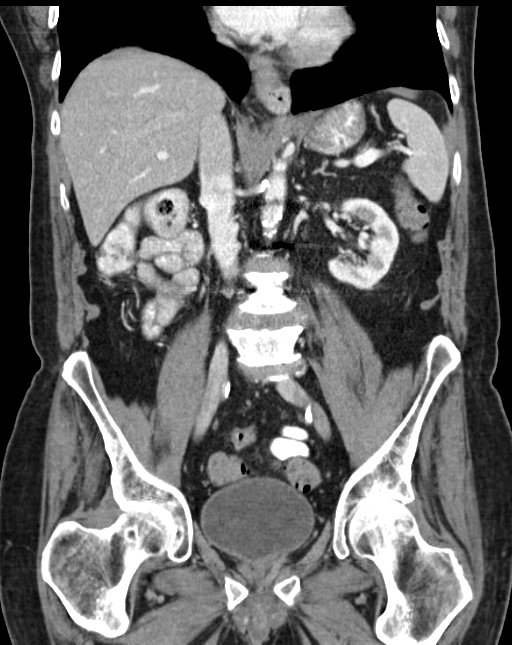

[16 of 46 positions shown; findings below may reference images not displayed]

FINDINGS: Lower chest: Minimal subpleural atelectasis or scarring noted. Mild
respiratory motion artifact at the lung bases. Median sternotomy
wires are partly visualized.

Hepatobiliary: Liver and gallbladder appear unremarkable.

Pancreas: Normal

Spleen: Normal

Adrenals/Urinary Tract: Adrenal glands are normal. Too small to
characterize left renal cortical hypodense lesions measuring 5 mm
and smaller are reidentified, compatible with small cysts. There has
been interval increase in size of a cystic/solid enhancing mass in
the right lower renal pole measuring 2.8 cm maximally image 34. No
hydronephrosis on either side. No ureteral calculus.

Stomach/Bowel: Normal appendix. No bowel wall thickening or focal
segmental dilatation is identified.

Vascular/Lymphatic: No lymphadenopathy. Moderate atheromatous aortic
calcification without aneurysm.

Other: No free air or fluid.

Musculoskeletal: Evidence of L5-S1 left
microdiscectomy/decompression. Fusion hardware reidentified spanning
L2 through L4. Multiple endplate compression deformities are
reidentified at that level. No acute osseous abnormality.
IMPRESSION: No acute intra-abdominal or pelvic pathology.

Apparent increase in size of enhancing cystic/ solid right lower
renal pole cortical mass, highly suspicious for renal cell
carcinoma. Consultation with urology is recommended.

These results were called by telephone at the time of interpretation
on 01/30/2015 at [DATE] to Dr. AMIRAH GEBRE , who verbally acknowledged
these results.

## 2015-10-11 DIAGNOSIS — I480 Paroxysmal atrial fibrillation: Secondary | ICD-10-CM | POA: Diagnosis not present

## 2015-10-11 DIAGNOSIS — R5383 Other fatigue: Secondary | ICD-10-CM | POA: Diagnosis not present

## 2015-10-11 DIAGNOSIS — N189 Chronic kidney disease, unspecified: Secondary | ICD-10-CM | POA: Diagnosis not present

## 2015-10-11 DIAGNOSIS — G1221 Amyotrophic lateral sclerosis: Secondary | ICD-10-CM | POA: Diagnosis not present

## 2015-10-11 DIAGNOSIS — I2510 Atherosclerotic heart disease of native coronary artery without angina pectoris: Secondary | ICD-10-CM | POA: Diagnosis not present

## 2015-10-11 DIAGNOSIS — Z9181 History of falling: Secondary | ICD-10-CM | POA: Diagnosis not present

## 2015-10-11 DIAGNOSIS — D649 Anemia, unspecified: Secondary | ICD-10-CM | POA: Diagnosis not present

## 2015-10-11 DIAGNOSIS — N183 Chronic kidney disease, stage 3 (moderate): Secondary | ICD-10-CM | POA: Diagnosis not present

## 2015-10-11 DIAGNOSIS — Z79899 Other long term (current) drug therapy: Secondary | ICD-10-CM | POA: Diagnosis not present

## 2015-10-11 DIAGNOSIS — R300 Dysuria: Secondary | ICD-10-CM | POA: Diagnosis not present

## 2015-10-11 DIAGNOSIS — R5381 Other malaise: Secondary | ICD-10-CM | POA: Diagnosis not present

## 2015-10-20 DIAGNOSIS — L853 Xerosis cutis: Secondary | ICD-10-CM | POA: Diagnosis not present

## 2015-10-20 DIAGNOSIS — L578 Other skin changes due to chronic exposure to nonionizing radiation: Secondary | ICD-10-CM | POA: Diagnosis not present

## 2015-10-20 DIAGNOSIS — L821 Other seborrheic keratosis: Secondary | ICD-10-CM | POA: Diagnosis not present

## 2015-10-20 DIAGNOSIS — L570 Actinic keratosis: Secondary | ICD-10-CM | POA: Diagnosis not present

## 2015-11-15 DIAGNOSIS — R350 Frequency of micturition: Secondary | ICD-10-CM | POA: Diagnosis not present

## 2015-11-15 DIAGNOSIS — N2889 Other specified disorders of kidney and ureter: Secondary | ICD-10-CM | POA: Diagnosis not present

## 2015-11-15 DIAGNOSIS — N3941 Urge incontinence: Secondary | ICD-10-CM | POA: Diagnosis not present

## 2015-11-22 DIAGNOSIS — R0602 Shortness of breath: Secondary | ICD-10-CM | POA: Diagnosis not present

## 2015-11-22 DIAGNOSIS — Z79899 Other long term (current) drug therapy: Secondary | ICD-10-CM | POA: Diagnosis not present

## 2015-11-22 DIAGNOSIS — I25110 Atherosclerotic heart disease of native coronary artery with unstable angina pectoris: Secondary | ICD-10-CM | POA: Diagnosis not present

## 2015-11-22 DIAGNOSIS — Z87891 Personal history of nicotine dependence: Secondary | ICD-10-CM | POA: Diagnosis not present

## 2015-11-22 DIAGNOSIS — Z951 Presence of aortocoronary bypass graft: Secondary | ICD-10-CM | POA: Diagnosis not present

## 2015-11-22 DIAGNOSIS — Z993 Dependence on wheelchair: Secondary | ICD-10-CM | POA: Diagnosis not present

## 2015-11-22 DIAGNOSIS — I200 Unstable angina: Secondary | ICD-10-CM | POA: Diagnosis not present

## 2015-11-22 DIAGNOSIS — Z538 Procedure and treatment not carried out for other reasons: Secondary | ICD-10-CM | POA: Diagnosis not present

## 2015-11-22 DIAGNOSIS — Z7982 Long term (current) use of aspirin: Secondary | ICD-10-CM | POA: Diagnosis not present

## 2015-11-22 DIAGNOSIS — F0390 Unspecified dementia without behavioral disturbance: Secondary | ICD-10-CM | POA: Diagnosis not present

## 2015-11-22 DIAGNOSIS — R079 Chest pain, unspecified: Secondary | ICD-10-CM | POA: Diagnosis not present

## 2015-11-22 DIAGNOSIS — G1221 Amyotrophic lateral sclerosis: Secondary | ICD-10-CM | POA: Diagnosis not present

## 2015-11-22 DIAGNOSIS — Z66 Do not resuscitate: Secondary | ICD-10-CM | POA: Diagnosis not present

## 2015-11-22 DIAGNOSIS — Z7902 Long term (current) use of antithrombotics/antiplatelets: Secondary | ICD-10-CM | POA: Diagnosis not present

## 2015-11-22 DIAGNOSIS — Z955 Presence of coronary angioplasty implant and graft: Secondary | ICD-10-CM | POA: Diagnosis not present

## 2015-11-22 DIAGNOSIS — I209 Angina pectoris, unspecified: Secondary | ICD-10-CM | POA: Diagnosis not present

## 2015-11-23 DIAGNOSIS — Z955 Presence of coronary angioplasty implant and graft: Secondary | ICD-10-CM | POA: Diagnosis not present

## 2015-11-23 DIAGNOSIS — F0390 Unspecified dementia without behavioral disturbance: Secondary | ICD-10-CM | POA: Diagnosis not present

## 2015-11-23 DIAGNOSIS — Z7982 Long term (current) use of aspirin: Secondary | ICD-10-CM | POA: Diagnosis not present

## 2015-11-23 DIAGNOSIS — Z79899 Other long term (current) drug therapy: Secondary | ICD-10-CM | POA: Diagnosis not present

## 2015-11-23 DIAGNOSIS — G1221 Amyotrophic lateral sclerosis: Secondary | ICD-10-CM | POA: Diagnosis not present

## 2015-11-23 DIAGNOSIS — I25110 Atherosclerotic heart disease of native coronary artery with unstable angina pectoris: Secondary | ICD-10-CM | POA: Diagnosis not present

## 2015-11-23 DIAGNOSIS — R079 Chest pain, unspecified: Secondary | ICD-10-CM | POA: Diagnosis not present

## 2015-11-23 DIAGNOSIS — Z951 Presence of aortocoronary bypass graft: Secondary | ICD-10-CM | POA: Diagnosis not present

## 2015-11-23 DIAGNOSIS — Z87891 Personal history of nicotine dependence: Secondary | ICD-10-CM | POA: Diagnosis not present

## 2015-11-23 DIAGNOSIS — Z993 Dependence on wheelchair: Secondary | ICD-10-CM | POA: Diagnosis not present

## 2015-11-23 DIAGNOSIS — Z538 Procedure and treatment not carried out for other reasons: Secondary | ICD-10-CM | POA: Diagnosis not present

## 2015-11-23 DIAGNOSIS — R0602 Shortness of breath: Secondary | ICD-10-CM | POA: Diagnosis not present

## 2015-11-23 DIAGNOSIS — Z7902 Long term (current) use of antithrombotics/antiplatelets: Secondary | ICD-10-CM | POA: Diagnosis not present

## 2015-11-23 DIAGNOSIS — I200 Unstable angina: Secondary | ICD-10-CM | POA: Diagnosis not present

## 2015-11-23 DIAGNOSIS — Z66 Do not resuscitate: Secondary | ICD-10-CM | POA: Diagnosis not present

## 2015-11-24 DIAGNOSIS — I509 Heart failure, unspecified: Secondary | ICD-10-CM | POA: Diagnosis not present

## 2015-11-24 DIAGNOSIS — R079 Chest pain, unspecified: Secondary | ICD-10-CM | POA: Diagnosis not present

## 2015-11-29 DIAGNOSIS — J181 Lobar pneumonia, unspecified organism: Secondary | ICD-10-CM | POA: Diagnosis not present

## 2015-11-29 DIAGNOSIS — F419 Anxiety disorder, unspecified: Secondary | ICD-10-CM | POA: Diagnosis not present

## 2015-11-29 DIAGNOSIS — R079 Chest pain, unspecified: Secondary | ICD-10-CM | POA: Diagnosis not present

## 2015-11-29 DIAGNOSIS — R531 Weakness: Secondary | ICD-10-CM | POA: Diagnosis not present

## 2015-11-29 DIAGNOSIS — R410 Disorientation, unspecified: Secondary | ICD-10-CM | POA: Diagnosis not present

## 2015-11-30 DIAGNOSIS — I70203 Unspecified atherosclerosis of native arteries of extremities, bilateral legs: Secondary | ICD-10-CM | POA: Diagnosis not present

## 2015-11-30 DIAGNOSIS — R262 Difficulty in walking, not elsewhere classified: Secondary | ICD-10-CM | POA: Diagnosis not present

## 2015-11-30 DIAGNOSIS — B351 Tinea unguium: Secondary | ICD-10-CM | POA: Diagnosis not present

## 2015-12-01 DIAGNOSIS — M62838 Other muscle spasm: Secondary | ICD-10-CM | POA: Diagnosis not present

## 2015-12-01 DIAGNOSIS — I1310 Hypertensive heart and chronic kidney disease without heart failure, with stage 1 through stage 4 chronic kidney disease, or unspecified chronic kidney disease: Secondary | ICD-10-CM | POA: Diagnosis not present

## 2015-12-01 DIAGNOSIS — I2510 Atherosclerotic heart disease of native coronary artery without angina pectoris: Secondary | ICD-10-CM | POA: Diagnosis not present

## 2015-12-01 DIAGNOSIS — G1221 Amyotrophic lateral sclerosis: Secondary | ICD-10-CM | POA: Diagnosis not present

## 2015-12-01 DIAGNOSIS — R1013 Epigastric pain: Secondary | ICD-10-CM | POA: Diagnosis not present

## 2015-12-01 DIAGNOSIS — N183 Chronic kidney disease, stage 3 (moderate): Secondary | ICD-10-CM | POA: Diagnosis not present

## 2015-12-01 DIAGNOSIS — R079 Chest pain, unspecified: Secondary | ICD-10-CM | POA: Diagnosis not present

## 2015-12-01 DIAGNOSIS — I480 Paroxysmal atrial fibrillation: Secondary | ICD-10-CM | POA: Diagnosis not present

## 2015-12-02 DIAGNOSIS — R109 Unspecified abdominal pain: Secondary | ICD-10-CM | POA: Diagnosis not present

## 2015-12-06 DIAGNOSIS — I2510 Atherosclerotic heart disease of native coronary artery without angina pectoris: Secondary | ICD-10-CM | POA: Diagnosis not present

## 2015-12-06 DIAGNOSIS — R079 Chest pain, unspecified: Secondary | ICD-10-CM | POA: Diagnosis not present

## 2015-12-06 DIAGNOSIS — G1221 Amyotrophic lateral sclerosis: Secondary | ICD-10-CM | POA: Diagnosis not present

## 2015-12-06 DIAGNOSIS — Z951 Presence of aortocoronary bypass graft: Secondary | ICD-10-CM | POA: Diagnosis not present

## 2015-12-06 DIAGNOSIS — I119 Hypertensive heart disease without heart failure: Secondary | ICD-10-CM | POA: Diagnosis not present

## 2015-12-06 DIAGNOSIS — I480 Paroxysmal atrial fibrillation: Secondary | ICD-10-CM | POA: Diagnosis not present

## 2015-12-07 DIAGNOSIS — N281 Cyst of kidney, acquired: Secondary | ICD-10-CM | POA: Diagnosis not present

## 2015-12-07 DIAGNOSIS — N2889 Other specified disorders of kidney and ureter: Secondary | ICD-10-CM | POA: Diagnosis not present

## 2015-12-08 DIAGNOSIS — H25013 Cortical age-related cataract, bilateral: Secondary | ICD-10-CM | POA: Diagnosis not present

## 2015-12-08 DIAGNOSIS — H04123 Dry eye syndrome of bilateral lacrimal glands: Secondary | ICD-10-CM | POA: Diagnosis not present

## 2015-12-08 DIAGNOSIS — H2513 Age-related nuclear cataract, bilateral: Secondary | ICD-10-CM | POA: Diagnosis not present

## 2015-12-08 DIAGNOSIS — H40013 Open angle with borderline findings, low risk, bilateral: Secondary | ICD-10-CM | POA: Diagnosis not present

## 2015-12-08 DIAGNOSIS — H35033 Hypertensive retinopathy, bilateral: Secondary | ICD-10-CM | POA: Diagnosis not present

## 2015-12-09 DIAGNOSIS — K828 Other specified diseases of gallbladder: Secondary | ICD-10-CM | POA: Diagnosis not present

## 2015-12-09 DIAGNOSIS — R1010 Upper abdominal pain, unspecified: Secondary | ICD-10-CM | POA: Diagnosis not present

## 2015-12-12 ENCOUNTER — Ambulatory Visit: Payer: PPO | Admitting: Neurology

## 2015-12-23 ENCOUNTER — Ambulatory Visit (INDEPENDENT_AMBULATORY_CARE_PROVIDER_SITE_OTHER): Payer: PPO | Admitting: Neurology

## 2015-12-23 ENCOUNTER — Encounter: Payer: Self-pay | Admitting: Neurology

## 2015-12-23 VITALS — BP 118/68 | HR 60 | Temp 98.3°F | Ht 71.0 in

## 2015-12-23 DIAGNOSIS — G1221 Amyotrophic lateral sclerosis: Secondary | ICD-10-CM

## 2015-12-23 NOTE — Progress Notes (Signed)
Follow-up Visit   Date: 12/23/15   Brandon Smith MRN: [id number] DOB: 1932-06-24   Interim History: Brandon Smith is a 82 y.o. right-handed Caucasian male with history of CAD s/p stent and CABG, hypertension, hyperlipidemia, lung cancer s/p lobectomy of right upper lung (1997), peripheral vascular disease, and back surgery x 5, returning to the clinic for follow-up of ALS.  The patient was accompanied to the clinic by nephew who also provides collateral information.    History of present illness: In 1997, he underwent back surgery due to lumbar radiculopathy which alleviated the pain. He had two similar bouts of low back pain in mid-2000s and underwent lumbar surgery again, which helped with the pain. He was able to get back to his usual activities and was walking independently. In 1999, he had a fall and suffered injured his left leg after falling off a ladder. He injured his peroneal nerve and had left foot drop since then.   During the summer of 2013, he recalls doing a lot of walking with his wife at the mall and noticed that his legs would fatigue quicker than previously. In 2014, he started using a cane. He has fall about dozen times over the past year and when he does fall, he cannot stand to raise himself. He was referred to see Dr. Carloyn Manner [facility] in [place]), neurosurgery, who recommended lumbar fusion to help his leg weakness. There was no associated numbness or back pain. He underwent surgery, but symptoms did not improve. Around 2014, his weakness progressed to the point where he was unable stand independently. He is currently walking with assistance of a walker at home and requires help from his wife for transfers, bathing, and dressing.   He complains burning pain of the lower legs bilaterally. He initially saw Dr. Jannifer Franklin, neurologist at [facility], in September 2014 for his weakness and paresthesias. EMG of the legs showed severe, end-stage left peroneal neuropathy and multilevel  lumbosacral radiculopathy. He also sought medical opinion at [facility] by Dr. Marvel Plan (ortho) who did not feel symptoms were amenable to surgical intervention.  Along his course, he was referred to Dr. Radford Pax, surgeon, who was concerned about akinetic parkinsonism, so referred the patient to Dr. Carles Collet, Movement disorder specialist at our clinic. Dr. Carles Collet did not find evidence to support the diagnosis of parkinson's disease and due to observation of fasciculations on exam, was highly concerned about motor neuron disease and subsequently ordered EMG and asked patient to follow-up with me.   UPDATE 02/26/2014:  Patient was last seen in the office on 01/26/2014 at which time the diagnosis of clinically probable ALS was discussed based on the fact that there was evidence of active and chronic motor axon loss changes affecting the cervical and lumbosacral region in addition to clinical examination changes supporting the diagnosis of motor neuron disease.  Currently, patient's biggest symptoms are bilateral leg weakness. He denies any problems swallowing or talking, or shortness of breath. He does not endorse significant arm weakness. Also over the past year, he has developed cramps of the hands.   He had one interval fall in the shower.  He was sitting on the shower seat, but slipped in the bathroom.  He has suffered bruises, but no significant injuries. He is getting home nursing, but they are only checking vital signs once per week and not helping with ADLs.    UPDATE 12/23/2015:  Patient was last seen in October 2015.  Since then he had been under  the care of Dr. Hosie Poisson at [facility] after the diagnoses of ALS was confirmed by Dr. Hart Robinsons in December 2016. He was found to have a right kidney lesion and has decided to follow it clinically, but does not wish to have surgery for this.  Because of distance to travel, he would like to resume his care here.  He has experienced several changes since hist last  visit.  He moved into [employer] with his wife in an apartment.  She unfortunately passed away in 04-05-2015 and since has been transitioned to a private room.  He has aides to help with bathing, dressing, and transfers.  He is able to feed himself.  He has been wheelchair-bound over the past year and now non-ambulatory.  He has a lift chair for transfers. He has a power chair for mobility.  His left hand began getting weaker in early 2017. He has been off riluzole for quite some time and does not wish to restart it.  There is no inappropriate laughter or crying. He denies any shortness of breath or difficulty swallowing.  No muscle cramps or pain.  Mood is okay.     Medications:  Current Outpatient Prescriptions on File Prior to Visit  Medication Sig Dispense Refill  . alendronate (FOSAMAX) 70 MG tablet Take 70 mg by mouth once a week. Saturday    . aspirin 81 MG tablet Take 81 mg by mouth daily.    . Calcium-Vitamin D 600-200 MG-UNIT per tablet Take 1 tablet by mouth daily.     . carvedilol (COREG) 12.5 MG tablet Take 12.5 mg by mouth 2 (two) times daily with a meal.     . clopidogrel (PLAVIX) 75 MG tablet Take 75 mg by mouth Daily.    Marland Kitchen gabapentin (NEURONTIN) 300 MG capsule Take 300 mg by mouth daily.    . minocycline (DYNACIN) 75 MG tablet Take 75 mg by mouth daily. once daily.    Marland Kitchen NITROSTAT 0.4 MG SL tablet Take 0.4 mg by mouth every 5 (five) minutes as needed for chest pain.     . polyethylene glycol (MIRALAX / GLYCOLAX) packet Take 17 g by mouth daily.      No current facility-administered medications on file prior to visit.     Allergies:  Allergies  Allergen Reactions  . Altace [Ramipril] Rash     Review of Systems:  CONSTITUTIONAL: No fevers, chills, night sweats, or weight loss.   EYES: No visual changes or eye pain ENT: No hearing changes.  No history of nose bleeds.   RESPIRATORY: No cough, wheezing and shortness of breath.   CARDIOVASCULAR:  Negative for chest pain, and palpitations.   GI: Negative for abdominal discomfort, blood in stools or black stools.  No recent change in bowel habits.   GU:  No history of incontinence.   MUSCLOSKELETAL: No history of joint pain or swelling.  No myalgias.   SKIN: Negative for lesions, rash, and itching.   ENDOCRINE: Negative for cold or heat intolerance, polydipsia or goiter.   PSYCH:  no depression or anxiety symptoms.   NEURO: As Above.   Vital Signs:  BP 118/68   Pulse 60   Temp 98.3 F (36.8 C)   Ht 5\' 11"  (1.803 m)   SpO2 96%   Neurological Exam: MENTAL STATUS including orientation to time, place, person, recent and remote memory, attention span and concentration, language, and fund of knowledge is normal.  Speech is not dysarthric.  CRANIAL NERVES:  Pupils equal round and reactive to light.  Normal conjugate, extra-ocular eye movements in all directions of gaze.  No ptosis.  Face is symmetric. Facial muscles are intact. Snout, Myerson's, and bilateral palmomental reflexes present. Palate elevates symmetrically.  Tongue is midline.    MOTOR:  Bilateral lower extremity atrophy (L>R), L FDI atrophy, active fasciculations over the extremities (arms > legs). Neck flexion and extension is 5/5.  Right Upper Extremity:    Left Upper Extremity:    Deltoid  5/5   Deltoid  4/5   Biceps  5/5   Biceps  4/5   Triceps  4+/5   Triceps  3+/5   Wrist extensors  5/5   Wrist extensors  4/5   Wrist flexors  5/5   Wrist flexors  4/5   Finger extensors  5/5   Finger extensors  4/5   Finger flexors  5/5   Finger flexors  4/5   Dorsal interossei  4+/5   Dorsal interossei  3/5   Abductor pollicis  4+/5   Abductor pollicis  3/5   Tone (Ashworth scale)  0   Tone (Ashworth scale)  0    Right Lower Extremity:    Left Lower Extremity:    Hip flexors  4/5   Hip flexors  4/5   Hip extensors  4/5   Hip extensors  4/5   Knee flexors  4/5   Knee flexors  4-/5   Knee extensors  4/5   Knee extensors  4/5     Dorsiflexors  3/5   Dorsiflexors  0/5   Plantarflexors  3/5   Plantarflexors  2/5   Toe extensors  4/5   Toe extensors  0/5   Toe flexors  4/5   Toe flexors  1/5   Tone (Ashworth scale)  0   Tone (Ashworth scale)  0    MSRs:  Right      Left  brachioradialis  3+   brachioradialis  3+   biceps  3+   biceps  3+   triceps  3+   triceps  3+   patellar  3+   patellar  3+   ankle jerk  0   ankle jerk  0   Bilateral crossed adductors. Vertical spread with patella testing. Medial pectoralis spreads to finger flexors.   COORDINATION/GAIT: Gait not assessed as patient is wheelchair-bound   Data: Labs 01/18/2014: SPEP/UPEP with IFE no M protein, vitamin B12 397, copper 81, Lyme neg, PTH 22, TSH 1.9   Labs 2014: AChR binding antibody neg, VGCC antibody neg, RF neg, ACE, eg ANA neg, RPR neg, CK 187   MRI cervical spine wo contrast 04/10/2013: Abnormal MRI scan of cervical spine showing mild spondylytic changes at C5-6 and C6-7 with disc flattening and by foraminal narrowing but no definite compression   MRI brain wo contrast 04/10/2013: Abnormal MRI scan of the brain showing moderate changes of chronic microvascular ischemia and generalized cerebral atrophy. Overall no significant change compared with CT head dated 01/20/2013.   EMG 01/21/2014:  1. The electrophysiologic findings are most consistent with a generalized sensorimotor polyneuropathy, predominantly axon loss in type. Overall, these findings are moderate in degree electrically. 2. There is also evidence of a superimposed multilevel active on chronic intraspinal canal lesions (i.e. radiculopathy, anterior horn cell) affecting the cervical and lumbosacral. The pattern of involvement can be seen in an evolving widespread disorder of the anterior horn cells; however, based on electrodiagnostic findings alone, the changes are insufficient for  the definitive electrodiagnosis of amyotrophic lateral sclerosis.    IMPRESSION: Mr. Suko is a 82  year-old gentleman returning to the clinic for follow-up of ALS.  Symptoms started in summer of 2013 with progressive painless bilateral lower extremity weakness. He had underwent extensive testing here and at [facility] confirming this diagnosis and was being followed at the [facility], but due to travel distance, would like to continue follow-up here.  His primarily deficits include bilateral leg weakness, limiting his ability to stand and walk, and now left hand weakness.  I think, he may benefit from palliative medicine services and he would like to discuss this with his daughter.  He would like more assistance with basic ADLs such as bathing, grooming, and dressing.  Otherwise, he has no pain, dyspnea, cramps, or dysphagia, which I will closely follow.    I also discussed Advance Care Planning including the discussion of the patient's goals of care and preferences, future treatment options and decision, and an explanation of advanced directives.  In addition to the patient, his nephew was present.  His daughter Arbie Cookey) is his POA and he had set up Advanced Directive and DNR.    Patient had the opportunity to ask questions and I answered them to the best of my ability.    Return to clinic in in 4 months   The duration of this appointment visit was 45 minutes of face-to-face time with the patient.  Greater than 50% of this time was spent in counseling, explanation of diagnosis, planning of further management, and coordination of care.   Thank you for allowing me to participate in patient's care.  If I can answer any additional questions, I would be pleased to do so.    Sincerely,    Donika K. Posey Pronto, DO

## 2015-12-23 NOTE — Patient Instructions (Addendum)
We will call your daughter to discuss options of palliative medication Return to clinic in 4 months

## 2015-12-24 DIAGNOSIS — G459 Transient cerebral ischemic attack, unspecified: Secondary | ICD-10-CM | POA: Diagnosis not present

## 2015-12-24 DIAGNOSIS — R29810 Facial weakness: Secondary | ICD-10-CM | POA: Diagnosis not present

## 2015-12-24 DIAGNOSIS — R531 Weakness: Secondary | ICD-10-CM | POA: Diagnosis not present

## 2015-12-24 DIAGNOSIS — R279 Unspecified lack of coordination: Secondary | ICD-10-CM | POA: Diagnosis not present

## 2015-12-24 DIAGNOSIS — R4182 Altered mental status, unspecified: Secondary | ICD-10-CM | POA: Diagnosis not present

## 2015-12-24 DIAGNOSIS — Z743 Need for continuous supervision: Secondary | ICD-10-CM | POA: Diagnosis not present

## 2015-12-24 DIAGNOSIS — I6789 Other cerebrovascular disease: Secondary | ICD-10-CM | POA: Diagnosis not present

## 2015-12-24 DIAGNOSIS — R930 Abnormal findings on diagnostic imaging of skull and head, not elsewhere classified: Secondary | ICD-10-CM | POA: Diagnosis not present

## 2016-01-03 DIAGNOSIS — N3941 Urge incontinence: Secondary | ICD-10-CM | POA: Diagnosis not present

## 2016-01-03 DIAGNOSIS — N2889 Other specified disorders of kidney and ureter: Secondary | ICD-10-CM | POA: Diagnosis not present

## 2016-01-03 DIAGNOSIS — R350 Frequency of micturition: Secondary | ICD-10-CM | POA: Diagnosis not present

## 2016-01-04 DIAGNOSIS — Z79899 Other long term (current) drug therapy: Secondary | ICD-10-CM | POA: Diagnosis not present

## 2016-01-04 DIAGNOSIS — D649 Anemia, unspecified: Secondary | ICD-10-CM | POA: Diagnosis not present

## 2016-01-04 DIAGNOSIS — N2889 Other specified disorders of kidney and ureter: Secondary | ICD-10-CM | POA: Diagnosis not present

## 2016-01-04 DIAGNOSIS — I2510 Atherosclerotic heart disease of native coronary artery without angina pectoris: Secondary | ICD-10-CM | POA: Diagnosis not present

## 2016-01-04 DIAGNOSIS — G1221 Amyotrophic lateral sclerosis: Secondary | ICD-10-CM | POA: Diagnosis not present

## 2016-01-04 DIAGNOSIS — I480 Paroxysmal atrial fibrillation: Secondary | ICD-10-CM | POA: Diagnosis not present

## 2016-01-04 DIAGNOSIS — I129 Hypertensive chronic kidney disease with stage 1 through stage 4 chronic kidney disease, or unspecified chronic kidney disease: Secondary | ICD-10-CM | POA: Diagnosis not present

## 2016-01-04 DIAGNOSIS — I6523 Occlusion and stenosis of bilateral carotid arteries: Secondary | ICD-10-CM | POA: Diagnosis not present

## 2016-01-04 DIAGNOSIS — N183 Chronic kidney disease, stage 3 (moderate): Secondary | ICD-10-CM | POA: Diagnosis not present

## 2016-01-04 DIAGNOSIS — Z8673 Personal history of transient ischemic attack (TIA), and cerebral infarction without residual deficits: Secondary | ICD-10-CM | POA: Diagnosis not present

## 2016-01-05 ENCOUNTER — Telehealth: Payer: Self-pay | Admitting: Neurology

## 2016-01-09 NOTE — Telephone Encounter (Signed)
Finished

## 2016-01-19 ENCOUNTER — Telehealth: Payer: Self-pay | Admitting: Neurology

## 2016-01-19 NOTE — Telephone Encounter (Signed)
I spoke with Brandon Smith and she said that she would be faxing some papers over for Dr. Posey Pronto to sign.

## 2016-01-19 NOTE — Telephone Encounter (Signed)
Brandon Smith 08-26-2032. Lovena Le from [facility] called regarding some paper work that was faxed on Brandon Smith, that needs Dr. Serita Grit signature.  She would like you to please call her at [phone]. Thank you

## 2016-02-21 ENCOUNTER — Telehealth: Payer: Self-pay | Admitting: Neurology

## 2016-02-21 NOTE — Telephone Encounter (Signed)
Patient daughter carol called and would like to speak to someone about her father please call [phone]

## 2016-02-24 NOTE — Telephone Encounter (Signed)
Called and spoke with daughter, Arbie Cookey. She states that her father's needs were greater than what was provided at assisted living facility and has now moved to skilled nursing facility.  She does not feel that he is ready for hospice and wanted my opinion.  She reports that he does not have any difficulty swallowing or shortness of breath and overall has been stable as compared to his last visit with me in August, in which case, I agree that he does not need hospice at this time.  If his clinical symptoms change, they will contact my office and we can initiate hospice at that time.  Joni Colegrove K. Posey Pronto, DO

## 2016-02-24 NOTE — Telephone Encounter (Signed)
Please follow up on message from [facility] inbasket. Thanks so much.  

## 2016-02-24 NOTE — Telephone Encounter (Signed)
Contacted pt's daughter Brandon Smith. She states her father had to be placed in a skilled facility this week instead of on hospice because of his evaluation with [facility] he was told he could live until Feb. She is wanting your opinion on this matter.

## 2016-03-02 DIAGNOSIS — K219 Gastro-esophageal reflux disease without esophagitis: Secondary | ICD-10-CM | POA: Diagnosis not present

## 2016-03-02 DIAGNOSIS — F0390 Unspecified dementia without behavioral disturbance: Secondary | ICD-10-CM | POA: Diagnosis not present

## 2016-03-02 DIAGNOSIS — I2510 Atherosclerotic heart disease of native coronary artery without angina pectoris: Secondary | ICD-10-CM | POA: Diagnosis not present

## 2016-03-02 DIAGNOSIS — I4891 Unspecified atrial fibrillation: Secondary | ICD-10-CM | POA: Diagnosis not present

## 2016-03-02 DIAGNOSIS — N181 Chronic kidney disease, stage 1: Secondary | ICD-10-CM | POA: Diagnosis not present

## 2016-03-02 DIAGNOSIS — M810 Age-related osteoporosis without current pathological fracture: Secondary | ICD-10-CM | POA: Diagnosis not present

## 2016-03-02 DIAGNOSIS — G1221 Amyotrophic lateral sclerosis: Secondary | ICD-10-CM | POA: Diagnosis not present

## 2016-03-02 DIAGNOSIS — I10 Essential (primary) hypertension: Secondary | ICD-10-CM | POA: Diagnosis not present

## 2016-04-06 DIAGNOSIS — N183 Chronic kidney disease, stage 3 (moderate): Secondary | ICD-10-CM | POA: Diagnosis not present

## 2016-04-06 DIAGNOSIS — M810 Age-related osteoporosis without current pathological fracture: Secondary | ICD-10-CM | POA: Diagnosis not present

## 2016-04-06 DIAGNOSIS — S0083XA Contusion of other part of head, initial encounter: Secondary | ICD-10-CM | POA: Diagnosis not present

## 2016-04-06 DIAGNOSIS — R2689 Other abnormalities of gait and mobility: Secondary | ICD-10-CM | POA: Diagnosis not present

## 2016-04-06 DIAGNOSIS — G1221 Amyotrophic lateral sclerosis: Secondary | ICD-10-CM | POA: Diagnosis not present

## 2016-04-06 DIAGNOSIS — F0391 Unspecified dementia with behavioral disturbance: Secondary | ICD-10-CM | POA: Diagnosis not present

## 2016-04-06 DIAGNOSIS — I4891 Unspecified atrial fibrillation: Secondary | ICD-10-CM | POA: Diagnosis not present

## 2016-04-06 DIAGNOSIS — I10 Essential (primary) hypertension: Secondary | ICD-10-CM | POA: Diagnosis not present

## 2016-04-27 DIAGNOSIS — F0391 Unspecified dementia with behavioral disturbance: Secondary | ICD-10-CM | POA: Diagnosis not present

## 2016-04-27 DIAGNOSIS — N183 Chronic kidney disease, stage 3 (moderate): Secondary | ICD-10-CM | POA: Diagnosis not present

## 2016-04-27 DIAGNOSIS — I2510 Atherosclerotic heart disease of native coronary artery without angina pectoris: Secondary | ICD-10-CM | POA: Diagnosis not present

## 2016-04-27 DIAGNOSIS — I4891 Unspecified atrial fibrillation: Secondary | ICD-10-CM | POA: Diagnosis not present

## 2016-04-27 DIAGNOSIS — M810 Age-related osteoporosis without current pathological fracture: Secondary | ICD-10-CM | POA: Diagnosis not present

## 2016-04-27 DIAGNOSIS — G1221 Amyotrophic lateral sclerosis: Secondary | ICD-10-CM | POA: Diagnosis not present

## 2016-04-27 DIAGNOSIS — I10 Essential (primary) hypertension: Secondary | ICD-10-CM | POA: Diagnosis not present

## 2016-05-25 ENCOUNTER — Encounter: Payer: Self-pay | Admitting: Neurology

## 2016-05-25 ENCOUNTER — Ambulatory Visit (INDEPENDENT_AMBULATORY_CARE_PROVIDER_SITE_OTHER): Payer: PPO | Admitting: Neurology

## 2016-05-25 VITALS — BP 110/70 | HR 70 | Temp 97.9°F

## 2016-05-25 DIAGNOSIS — G1221 Amyotrophic lateral sclerosis: Secondary | ICD-10-CM

## 2016-05-25 MED ORDER — AMBULATORY NON FORMULARY MEDICATION: 1.0000 [IU] | Freq: Every day | 0 refills | Status: AC

## 2016-05-25 NOTE — Patient Instructions (Addendum)
Return to clinic in 4 months.

## 2016-05-25 NOTE — Progress Notes (Signed)
Follow-up Visit   Date: 05/25/16   Brandon Smith MRN: [id number] DOB: 08/24/1932   Interim History: Brandon Smith is a 83 y.o. right-handed Caucasian male with history of CAD s/p stent and CABG, hypertension, hyperlipidemia, lung cancer s/p lobectomy of right upper lung (1997), peripheral vascular disease, and back surgery x 5, returning to the clinic for follow-up of ALS.  The patient was accompanied to the clinic by nephew who also provides collateral information.    History of present illness: In 1997, he underwent back surgery due to lumbar radiculopathy which alleviated the pain. He had two similar bouts of low back pain in mid-2000s and underwent lumbar surgery again, which helped with the pain. He was able to get back to his usual activities and was walking independently. In 1999, he had a fall and suffered injured his left leg after falling off a ladder. He injured his peroneal nerve and had left foot drop since then.   During the summer of 2013, he recalls doing a lot of walking with his wife at the mall and noticed that his legs would fatigue quicker than previously. In 2014, he started using a cane. He has fall about dozen times over the past year and when he does fall, he cannot stand to raise himself. He was referred to see Dr. Carloyn Manner [facility] in [place]), neurosurgery, who recommended lumbar fusion to help his leg weakness. There was no associated numbness or back pain. He underwent surgery, but symptoms did not improve. Around 2014, his weakness progressed to the point where he was unable stand independently. He is currently walking with assistance of a walker at home and requires help from his wife for transfers, bathing, and dressing.   He complains burning pain of the lower legs bilaterally. He initially saw Dr. Jannifer Smith, neurologist at [facility], in September 2014 for his weakness and paresthesias. EMG of the legs showed severe, end-stage left peroneal neuropathy and multilevel  lumbosacral radiculopathy. He also sought medical opinion at [facility] by Dr. Marvel Smith (ortho) who did not feel symptoms were amenable to surgical intervention.  Along his course, he was referred to Brandon Smith, surgeon, who was concerned about akinetic parkinsonism, so referred the patient to Dr. Carles Smith, Movement disorder specialist at our clinic. Dr. Carles Smith did not find evidence to support the diagnosis of parkinson's disease and due to observation of fasciculations on exam, was highly concerned about motor neuron disease and subsequently ordered EMG and asked patient to follow-up with me.   UPDATE 02/26/2014:  Patient was last seen in the office on 01/26/2014 at which time the diagnosis of clinically probable ALS was discussed based on the fact that there was evidence of active and chronic motor axon loss changes affecting the cervical and lumbosacral region in addition to clinical examination changes supporting the diagnosis of motor neuron disease.  Currently, patient's biggest symptoms are bilateral leg weakness. He denies any problems swallowing or talking, or shortness of breath. He does not endorse significant arm weakness. Also over the past year, he has developed cramps of the hands.   He had one interval fall in the shower.  He was sitting on the shower seat, but slipped in the bathroom.  He has suffered bruises, but no significant injuries. He is getting home nursing, but they are only checking vital signs once per week and not helping with ADLs.    UPDATE 12/23/2015:  Patient was last seen in October 2015.  Since then he had been under  the care of Dr. Hosie Smith at [facility] after the diagnoses of ALS was confirmed by Dr. Hart Smith in December 2016. He was found to have a right kidney lesion and has decided to follow it clinically, but does not wish to have surgery for this.  Because of distance to travel, he would like to resume his care here.  He has experienced several changes since hist last  visit.  He moved into [employer] with his wife in an apartment.  She unfortunately passed away in 2015-04-29 and since has been transitioned to a private room.  He has aides to help with bathing, dressing, and transfers.  He is able to feed himself.  He has been wheelchair-bound over the past year and now non-ambulatory.  He has a lift chair for transfers. He has a power chair for mobility.  His left hand began getting weaker in early 2017. He has been off riluzole for quite some time and does not wish to restart it.  UPDATE 05/26/2015:   Patient is here for 4 month follow-up visit.  Overall, there has been a steady decline in his motor function, he is no longer able to feed himself due to hand weakness.  He has suffered several falls from his bed while turning.  He denies any shortness of breath or difficulty swallowing.  No muscle cramps or pain.  Mood is good. There is no inappropriate laughter or crying.   He is moving to [facility] facility today and should continue to receive palliative care/hospice services.   Medications:  Current Outpatient Prescriptions on File Prior to Visit  Medication Sig Dispense Refill  . Acetaminophen (TYLENOL EXTRA STRENGTH PO) Take by mouth.    Marland Kitchen alendronate (FOSAMAX) 70 MG tablet Take 70 mg by mouth once a week. Saturday    . aspirin 81 MG tablet Take 81 mg by mouth daily.    . Calcium-Vitamin D 600-200 MG-UNIT per tablet Take 1 tablet by mouth daily.     . carvedilol (COREG) 12.5 MG tablet Take 12.5 mg by mouth 2 (two) times daily with a meal.     . clopidogrel (PLAVIX) 75 MG tablet Take 75 mg by mouth Daily.    Marland Kitchen gabapentin (NEURONTIN) 300 MG capsule Take 300 mg by mouth daily.    . minocycline (DYNACIN) 75 MG tablet Take 75 mg by mouth daily. once daily.    Marland Kitchen NITROSTAT 0.4 MG SL tablet Take 0.4 mg by mouth every 5 (five) minutes as needed for chest pain.     . polyethylene glycol (MIRALAX / GLYCOLAX) packet Take 17 g by mouth daily.       . Probiotic Product (PROBIOTIC DAILY PO) Take by mouth.     No current facility-administered medications on file prior to visit.     Allergies:  Allergies  Allergen Reactions  . Altace [Ramipril] Rash     Review of Systems:  CONSTITUTIONAL: No fevers, chills, night sweats, or weight loss.   EYES: No visual changes or eye pain ENT: No hearing changes.  No history of nose bleeds.   RESPIRATORY: No cough, wheezing and shortness of breath.   CARDIOVASCULAR: Negative for chest pain, and palpitations.   GI: Negative for abdominal discomfort, blood in stools or black stools.  No recent change in bowel habits.   GU:  No history of incontinence.   MUSCLOSKELETAL: No history of joint pain or swelling.  No myalgias.   SKIN: Negative for lesions, rash, and itching.  ENDOCRINE: Negative for cold or heat intolerance, polydipsia or goiter.   PSYCH:  no depression or anxiety symptoms.   NEURO: As Above.   Vital Signs:  BP 110/70   Pulse 70   Temp 97.9 F (36.6 C)   SpO2 98%   Neurological Exam: MENTAL STATUS including orientation to time, place, person, recent and remote memory, attention span and concentration, language, and fund of knowledge is normal.  Speech is not dysarthric.  CRANIAL NERVES:  Pupils equal round and reactive to light.  Normal conjugate, extra-ocular eye movements in all directions of gaze.  No ptosis.  Face is symmetric. Facial muscles are intact. Snout, Myerson's, and bilateral palmomental reflexes present. Palate elevates symmetrically.  Tongue is midline.    MOTOR:  Bilateral lower extremity atrophy (L>R), L FDI atrophy, active fasciculations over the extremities (arms > legs). Neck flexion and extension is 5/5.  Right Upper Extremity:    Left Upper Extremity:    Deltoid  4/5   Deltoid  4/5   Biceps  5/5   Biceps  4/5   Triceps  4+/5   Triceps  3+/5   Wrist extensors  5/5   Wrist extensors  4/5   Wrist flexors  5/5   Wrist flexors  4/5   Finger extensors   4/5   Finger extensors  4/5   Finger flexors  4/5   Finger flexors  4/5   Dorsal interossei  4+/5   Dorsal interossei  3/5   Abductor pollicis  4+/5   Abductor pollicis  3/5   Tone (Ashworth scale)  0   Tone (Ashworth scale)  0    Right Lower Extremity:    Left Lower Extremity:    Hip flexors  4-/5   Hip flexors  4-/5   Hip extensors  4-/5   Hip extensors  4-/5   Knee flexors  4-/5   Knee flexors  4-/5   Knee extensors  4-/5   Knee extensors  4/5   Dorsiflexors  3/5   Dorsiflexors  0/5   Plantarflexors  3/5   Plantarflexors  2/5   Toe extensors  4/5   Toe extensors  0/5   Toe flexors  4/5   Toe flexors  1/5   Tone (Ashworth scale)  0   Tone (Ashworth scale)  0     COORDINATION/GAIT: Gait not assessed as patient is wheelchair-bound   Data: Labs 01/18/2014: SPEP/UPEP with IFE no M protein, vitamin B12 397, copper 81, Lyme neg, PTH 22, TSH 1.9   Labs 2014: AChR binding antibody neg, VGCC antibody neg, RF neg, ACE, eg ANA neg, RPR neg, CK 187   MRI cervical spine wo contrast 04/10/2013: Abnormal MRI scan of cervical spine showing mild spondylytic changes at C5-6 and C6-7 with disc flattening and by foraminal narrowing but no definite compression   MRI brain wo contrast 04/10/2013: Abnormal MRI scan of the brain showing moderate changes of chronic microvascular ischemia and generalized cerebral atrophy. Overall no significant change compared with CT head dated 01/20/2013.   EMG 01/21/2014:  1. The electrophysiologic findings are most consistent with a generalized sensorimotor polyneuropathy, predominantly axon loss in type. Overall, these findings are moderate in degree electrically. 2. There is also evidence of a superimposed multilevel active on chronic intraspinal canal lesions (i.e. radiculopathy, anterior horn cell) affecting the cervical and lumbosacral. The pattern of involvement can be seen in an evolving widespread disorder of the anterior horn cells; however, based on  electrodiagnostic findings alone,  the changes are insufficient for the definitive electrodiagnosis of amyotrophic lateral sclerosis.    IMPRESSION: Mr. Ayles is a 83 year-old gentleman returning to the clinic for follow-up of ALS.  Symptoms started in summer of 2013 with progressive painless bilateral lower extremity weakness. He had underwent extensive testing here and at [facility] confirming this diagnosis and was being followed at the [facility], but transitioned his care here.  Clinically, there is progressive weakness and atrophy of the legs and now hands, which limits his ability to feed himself.  He is transitioning to a new SNF today and should be getting palliative care services there also.  His daughter also reports that for the past 3 months, he is having increased confusional spells at night and needs to be redirected by staff and sometimes is medicated with lorazepam.  I recommend nonpharmacological strategies and try to avoid using medication for his behavior changes and delirium.  He may be developed early signs of dementia.  I will follow him for changes in personality as ALS patient can also develop frontotemporal dementia.  He has no pain, dyspnea, cramps, or dysphagia.    Return to clinic in in 4 months   The duration of this appointment visit was 25 minutes of face-to-face time with the patient.  Greater than 50% of this time was spent in counseling, explanation of diagnosis, planning of further management, and coordination of care.   Thank you for allowing me to participate in patient's care.  If I can answer any additional questions, I would be pleased to do so.    Sincerely,    Alanson Hausmann K. Posey Pronto, DO

## 2016-07-12 DIAGNOSIS — R350 Frequency of micturition: Secondary | ICD-10-CM | POA: Diagnosis not present

## 2016-07-12 DIAGNOSIS — N3941 Urge incontinence: Secondary | ICD-10-CM | POA: Diagnosis not present

## 2016-07-12 DIAGNOSIS — N2889 Other specified disorders of kidney and ureter: Secondary | ICD-10-CM | POA: Diagnosis not present

## 2016-07-19 DIAGNOSIS — N19 Unspecified kidney failure: Secondary | ICD-10-CM | POA: Diagnosis not present

## 2016-08-08 ENCOUNTER — Telehealth: Payer: Self-pay | Admitting: Neurology

## 2016-08-08 NOTE — Telephone Encounter (Signed)
Where is the pain?  Should address pain issues with primary physician as he may need to be evaluated for the cause of his pain (infection, skin breakdown, etc).  His behavior changes may also be stemming from this, if it is new.   If his infectious work-up with PCP returns negative, let's see him back in the clinic for evaluation of his behavior changes.  Lawyer Washabaugh K. Posey Pronto, DO

## 2016-08-08 NOTE — Telephone Encounter (Signed)
Brandon Smith 10-Aug-2032. His daughter called and said he is in a lot of pain and said he yells out which is not like him. She wants to know if there is anything to give him? Her # [phone]. He is in [place] home. Thank you

## 2016-08-08 NOTE — Telephone Encounter (Signed)
Spoke to daughter Arbie Cookey Pt c/o of severe pain in R Leg primarily. Complains of pain anytime he is moved or even touched also. Gave daughter instructions per Dr.Patel's previous note. Dghter states she does not think pt could make it out to be evaluated b/c he yells out constantly when ever he is moved.  Encouraged dghtr to be involved w/ physicians who visit pt in the facility. Dghtr agreed and was very grateful for recommendations.

## 2016-08-19 DEATH — deceased

## 2016-09-21 ENCOUNTER — Ambulatory Visit: Payer: PPO | Admitting: Neurology
